# Patient Record
Sex: Female | Born: 1985 | Race: White | Hispanic: No | Marital: Married | Smoking: Never smoker
Health system: Southern US, Community
[De-identification: ages and names within clinical notes are randomized; demographics above are authoritative.]

## PROBLEM LIST (undated history)

## (undated) DIAGNOSIS — Z789 Other specified health status: Secondary | ICD-10-CM

## (undated) SURGERY — Surgical Case
Anesthesia: General

---

## 2016-05-05 ENCOUNTER — Other Ambulatory Visit: Payer: Self-pay | Admitting: Obstetrics & Gynecology

## 2016-05-05 DIAGNOSIS — Z3689 Encounter for other specified antenatal screening: Secondary | ICD-10-CM

## 2016-06-15 ENCOUNTER — Other Ambulatory Visit: Payer: Self-pay | Admitting: Obstetrics & Gynecology

## 2016-06-15 ENCOUNTER — Ambulatory Visit
Admission: RE | Admit: 2016-06-15 | Discharge: 2016-06-15 | Disposition: A | Discharge status: Home / Self Care | Payer: BC Managed Care – PPO | Source: Ambulatory Visit | Attending: Obstetrics and Gynecology | Admitting: Obstetrics and Gynecology

## 2016-06-15 ENCOUNTER — Ambulatory Visit
Admission: RE | Admit: 2016-06-15 | Discharge: 2016-06-15 | Disposition: A | Discharge status: Home / Self Care | Payer: BC Managed Care – PPO | Source: Ambulatory Visit | Attending: Obstetrics & Gynecology | Admitting: Obstetrics & Gynecology

## 2016-06-15 DIAGNOSIS — Z3A20 20 weeks gestation of pregnancy: Secondary | ICD-10-CM | POA: Diagnosis not present

## 2016-06-15 DIAGNOSIS — O321XX2 Maternal care for breech presentation, fetus 2: Secondary | ICD-10-CM | POA: Diagnosis not present

## 2016-06-15 DIAGNOSIS — Z3689 Encounter for other specified antenatal screening: Secondary | ICD-10-CM | POA: Insufficient documentation

## 2016-06-15 DIAGNOSIS — O30032 Twin pregnancy, monochorionic/diamniotic, second trimester: Secondary | ICD-10-CM

## 2016-07-02 ENCOUNTER — Other Ambulatory Visit: Payer: Self-pay | Admitting: *Deleted

## 2016-07-02 DIAGNOSIS — IMO0001 Reserved for inherently not codable concepts without codable children: Secondary | ICD-10-CM

## 2016-07-16 ENCOUNTER — Other Ambulatory Visit: Payer: Self-pay | Admitting: *Deleted

## 2016-07-16 DIAGNOSIS — IMO0001 Reserved for inherently not codable concepts without codable children: Secondary | ICD-10-CM

## 2016-07-20 ENCOUNTER — Ambulatory Visit
Admission: RE | Admit: 2016-07-20 | Discharge: 2016-07-20 | Disposition: A | Discharge status: Home / Self Care | Payer: BC Managed Care – PPO | Source: Ambulatory Visit | Attending: Maternal and Fetal Medicine | Admitting: Maternal and Fetal Medicine

## 2016-07-20 DIAGNOSIS — Z3A25 25 weeks gestation of pregnancy: Secondary | ICD-10-CM | POA: Insufficient documentation

## 2016-07-20 DIAGNOSIS — Z3689 Encounter for other specified antenatal screening: Secondary | ICD-10-CM | POA: Insufficient documentation

## 2016-07-20 DIAGNOSIS — IMO0001 Reserved for inherently not codable concepts without codable children: Secondary | ICD-10-CM

## 2016-07-20 DIAGNOSIS — O30032 Twin pregnancy, monochorionic/diamniotic, second trimester: Secondary | ICD-10-CM | POA: Insufficient documentation

## 2016-07-20 HISTORY — DX: Other specified health status: Z78.9

## 2016-07-23 ENCOUNTER — Other Ambulatory Visit: Payer: Self-pay | Admitting: Obstetrics & Gynecology

## 2016-07-23 ENCOUNTER — Telehealth: Payer: Self-pay | Admitting: Obstetrics & Gynecology

## 2016-07-23 DIAGNOSIS — O30039 Twin pregnancy, monochorionic/diamniotic, unspecified trimester: Secondary | ICD-10-CM

## 2016-07-23 NOTE — Telephone Encounter (Signed)
Patient scheduled for H&P on 10/13/16 @ 2:30pm and OR on 10/15/16. My phone# and ext were given.

## 2016-07-27 ENCOUNTER — Telehealth: Payer: Self-pay

## 2016-07-27 ENCOUNTER — Other Ambulatory Visit: Payer: Self-pay | Admitting: Obstetrics & Gynecology

## 2016-07-27 DIAGNOSIS — O30033 Twin pregnancy, monochorionic/diamniotic, third trimester: Secondary | ICD-10-CM

## 2016-07-27 NOTE — Telephone Encounter (Signed)
Pt is 26wks preg c twins calling to see what she can take for a bad cold c yellow mucus.  She has been taking robitussin for 2d.  Adv she can take robitussin or switch to plain musinex.  Also, plain sudafed, e.s. Tylenol, warm salt water gargles, Hall's cough drops, sucrets, sip hot beverages (watch caffiene)/hot soup, push fluids and rest.

## 2016-07-29 ENCOUNTER — Ambulatory Visit (INDEPENDENT_AMBULATORY_CARE_PROVIDER_SITE_OTHER): Payer: BC Managed Care – PPO | Admitting: Obstetrics & Gynecology

## 2016-07-29 ENCOUNTER — Ambulatory Visit (INDEPENDENT_AMBULATORY_CARE_PROVIDER_SITE_OTHER): Payer: BC Managed Care – PPO

## 2016-07-29 ENCOUNTER — Other Ambulatory Visit: Payer: BC Managed Care – PPO

## 2016-07-29 VITALS — BP 110/70 | Wt 158.0 lb

## 2016-07-29 DIAGNOSIS — Z3A26 26 weeks gestation of pregnancy: Secondary | ICD-10-CM

## 2016-07-29 DIAGNOSIS — O30033 Twin pregnancy, monochorionic/diamniotic, third trimester: Secondary | ICD-10-CM

## 2016-07-29 DIAGNOSIS — O30009 Twin pregnancy, unspecified number of placenta and unspecified number of amniotic sacs, unspecified trimester: Secondary | ICD-10-CM | POA: Insufficient documentation

## 2016-07-29 DIAGNOSIS — O30039 Twin pregnancy, monochorionic/diamniotic, unspecified trimester: Secondary | ICD-10-CM

## 2016-07-29 DIAGNOSIS — Z113 Encounter for screening for infections with a predominantly sexual mode of transmission: Secondary | ICD-10-CM

## 2016-07-29 DIAGNOSIS — Z131 Encounter for screening for diabetes mellitus: Secondary | ICD-10-CM

## 2016-07-29 DIAGNOSIS — Z98891 History of uterine scar from previous surgery: Secondary | ICD-10-CM | POA: Insufficient documentation

## 2016-07-29 DIAGNOSIS — O09819 Supervision of pregnancy resulting from assisted reproductive technology, unspecified trimester: Secondary | ICD-10-CM | POA: Insufficient documentation

## 2016-07-29 NOTE — Progress Notes (Signed)
Mo/Di Twins, doing well, US normal for growth and AFI, Vtx/Vtx, plans CS May 24 (PH) DP every 2 weeks to monitor for TTTS APT 32 weeks Velamenrous insertion 3cm from cervix, risks discussed. Plans Breast feeding.  Possible IUD.

## 2016-07-30 ENCOUNTER — Other Ambulatory Visit: Payer: Self-pay | Admitting: *Deleted

## 2016-07-30 DIAGNOSIS — O30009 Twin pregnancy, unspecified number of placenta and unspecified number of amniotic sacs, unspecified trimester: Secondary | ICD-10-CM

## 2016-07-30 LAB — 28 WEEK RH+PANEL
BASOS: 0 %
Basophils Absolute: 0 10*3/uL (ref 0.0–0.2)
EOS (ABSOLUTE): 0.1 10*3/uL (ref 0.0–0.4)
Eos: 1 %
GESTATIONAL DIABETES SCREEN: 117 mg/dL (ref 65–139)
HEMOGLOBIN: 9.6 g/dL — AB (ref 11.1–15.9)
HIV Screen 4th Generation wRfx: NONREACTIVE
Hematocrit: 28.2 % — ABNORMAL LOW (ref 34.0–46.6)
IMMATURE GRANS (ABS): 0 10*3/uL (ref 0.0–0.1)
Immature Granulocytes: 0 %
LYMPHS ABS: 1.2 10*3/uL (ref 0.7–3.1)
Lymphs: 15 %
MCH: 31.5 pg (ref 26.6–33.0)
MCHC: 34 g/dL (ref 31.5–35.7)
MCV: 93 fL (ref 79–97)
Monocytes Absolute: 0.5 10*3/uL (ref 0.1–0.9)
Monocytes: 6 %
NEUTROS ABS: 6.1 10*3/uL (ref 1.4–7.0)
Neutrophils: 78 %
Platelets: 192 10*3/uL (ref 150–379)
RBC: 3.05 x10E6/uL — AB (ref 3.77–5.28)
RDW: 12.9 % (ref 12.3–15.4)
RPR: NONREACTIVE
WBC: 7.9 10*3/uL (ref 3.4–10.8)

## 2016-08-03 ENCOUNTER — Ambulatory Visit
Admission: RE | Admit: 2016-08-03 | Discharge: 2016-08-03 | Disposition: A | Discharge status: Home / Self Care | Payer: BC Managed Care – PPO | Source: Ambulatory Visit | Attending: Obstetrics and Gynecology | Admitting: Obstetrics and Gynecology

## 2016-08-03 DIAGNOSIS — O30032 Twin pregnancy, monochorionic/diamniotic, second trimester: Secondary | ICD-10-CM | POA: Insufficient documentation

## 2016-08-03 DIAGNOSIS — Z3A27 27 weeks gestation of pregnancy: Secondary | ICD-10-CM | POA: Diagnosis not present

## 2016-08-03 DIAGNOSIS — O30009 Twin pregnancy, unspecified number of placenta and unspecified number of amniotic sacs, unspecified trimester: Secondary | ICD-10-CM

## 2016-08-03 NOTE — Progress Notes (Signed)
Sp[facility] with [facility] at [facility] [place] [facility] and requested an appointment for a fetal echo ASAP.  Selena BattenKim is calling patient to schedule.

## 2016-08-04 ENCOUNTER — Telehealth: Payer: Self-pay

## 2016-08-04 NOTE — Telephone Encounter (Signed)
Rec Mucinex for congestion and drainage; if fever or severe cough then appt

## 2016-08-04 NOTE — Telephone Encounter (Signed)
Pt states she is 28 wks w/twins & is struggling w/cold x2.5 wks. Drainage has turned green. She's having severe headache unrelieved by Tylenol. No relief w/OTC meds. Difficulty sleeping. WU#[id number]#[phone].

## 2016-08-05 NOTE — Telephone Encounter (Signed)
Left message relaying [facility] recommendations. Advised to call if needs/desires apt.

## 2016-08-12 ENCOUNTER — Ambulatory Visit (INDEPENDENT_AMBULATORY_CARE_PROVIDER_SITE_OTHER): Payer: BC Managed Care – PPO | Admitting: Advanced Practice Midwife

## 2016-08-12 VITALS — BP 122/72 | Wt 167.0 lb

## 2016-08-12 DIAGNOSIS — Z3A28 28 weeks gestation of pregnancy: Secondary | ICD-10-CM

## 2016-08-12 NOTE — Progress Notes (Signed)
Doing well. c/o pain and pressure in pelvis. Recommend support belt and increased hydration. Positive fetal movement x2. Baby A is on maternal right and is vertex. FHR 150. Baby B is on maternal left and is vertex. FHR 130. Echocardiogram wnl per pt report.

## 2016-08-12 NOTE — Progress Notes (Signed)
Vaginal area pressure x 3 days/back hurts/hard to rest at night

## 2016-08-12 NOTE — Patient Instructions (Signed)
Multiple Pregnancy Having a multiple pregnancy means that a woman is carrying more than one baby at a time. She may be pregnant with twins, triplets, or more. The majority of multiple pregnancies are twins. Naturally conceiving triplets or more (higher-order multiples) is rare. Multiple pregnancies are riskier than single pregnancies. A woman with a multiple pregnancy is more likely to have certain problems during her pregnancy. Therefore, she will need to have more frequent appointments for prenatal care. How does a multiple pregnancy happen? A multiple pregnancy happens when:  The woman's body releases more than one egg at a time, and then each egg gets fertilized by a different sperm.  This is the most common type of multiple pregnancy.  Twins or other multiples produced this way are fraternal. They are no more alike than non-multiple siblings are.  One sperm fertilizes one egg, which then divides into more than one embryo.  Twins or other multiples produced this way are identical. Identical multiples are always the same gender, and they look very much alike. Who is most likely to have a multiple pregnancy? A multiple pregnancy is more likely to develop in women who:  Have had fertility treatment, especially if the treatment included fertility drugs.  Are older than 31 years of age.  Have already had four or more children.  Have a family history of multiple pregnancy. How is a multiple pregnancy diagnosed? A multiple pregnancy may be diagnosed based on:  Symptoms such as:  Rapid weight gain in the first 3 months of pregnancy (first trimester).  More severe nausea and breast tenderness than what is typical of a single pregnancy.  The uterus measuring larger than what is normal for the stage of the pregnancy.  Blood tests that detect a higher-than-normal level of human chorionic gonadotropin (hCG). This is a hormone that your body produces in early pregnancy.  Ultrasound exam.  This is used to confirm that you are carrying multiples. What risks are associated with multiple pregnancy? A multiple pregnancy puts you at a higher risk for certain problems during or after your pregnancy, including:  Having your babies delivered before you have reached a full-term pregnancy (preterm birth). A full-term pregnancy lasts for at least 37 weeks. Babies born before 37 weeks may have a higher risk of a variety of health problems, such as breathing problems, feeding difficulties, cerebral palsy, and learning disabilities.  Diabetes.  Preeclampsia. This is a serious condition that causes high blood pressure along with other symptoms, such as swelling and headaches, during pregnancy.  Excessive blood loss after childbirth (postpartum hemorrhage).  Postpartum depression.  Low birth weight of the babies. How will having a multiple pregnancy affect my care? Your health care provider will want to monitor you more closely during your pregnancy to make sure that your babies are growing normally and that you are healthy. Follow these instructions at home: Because your pregnancy is considered to be high risk, you will need to work closely with your health care team. You may also need to make some lifestyle changes. These may include the following: Eating and drinking   Increase your nutrition.  Follow your health care provider's recommendations for weight gain. You may need to gain a little extra weight when you are pregnant with multiples.  Eat healthy snacks often throughout the day. This can add calories and reduce nausea.  Drink enough fluid to keep your urine clear or pale yellow.  Take prenatal vitamins. Activity  By 20-24 weeks, you may need to   limit your activities.  Avoid activities and work that take a lot of effort (are strenuous).  Ask your health care provider when you should stop having sexual intercourse.  Rest often. General instructions   Do not use any  products that contain nicotine or tobacco, such as cigarettes and e-cigarettes. If you need help quitting, ask your health care provider.  Do not drink alcohol or use illegal drugs.  Take over-the-counter and prescription medicines only as told by your health care provider.  Arrange for extra help around the house.  Keep all follow-up visits and all prenatal visits as told by your health care provider. This is important. Contact a health care provider if:  You have dizziness.  You have persistent nausea, vomiting, or diarrhea.  You are having trouble gaining weight.  You have feelings of depression or other emotions that are interfering with your normal activities. Get help right away if:  You have a fever.  You have pain with urination.  You have fluid leaking from your vagina.  You have a bad-smelling vaginal discharge.  You notice increased swelling in your face, hands, legs, or ankles.  You have spotting or bleeding from your vagina.  You have pelvic cramps, pelvic pressure, or nagging pain in your abdomen or lower back.  You are having regular contractions.  You develop a severe headache, with or without visual changes.  You have shortness of breath or chest pain.  You notice less fetal movement, or no fetal movement. This information is not intended to replace advice given to you by your health care provider. Make sure you discuss any questions you have with your health care provider. Document Released: 02/18/2008 Document Revised: 01/10/2016 Document Reviewed: 01/10/2016 Elsevier Interactive Patient Education  2017 [employer]. Third Trimester of Pregnancy The third trimester is from week 28 through week 40 (months 7 through 9). The third trimester is a time when the unborn baby (fetus) is growing rapidly. At the end of the ninth month, the fetus is about 20 inches in length and weighs 6-10 pounds. Body changes during your third trimester Your body will  continue to go through many changes during pregnancy. The changes vary from woman to woman. During the third trimester:  Your weight will continue to increase. You can expect to gain 25-35 pounds (11-16 kg) by the end of the pregnancy.  You may begin to get stretch marks on your hips, abdomen, and breasts.  You may urinate more often because the fetus is moving lower into your pelvis and pressing on your bladder.  You may develop or continue to have heartburn. This is caused by increased hormones that slow down muscles in the digestive tract.  You may develop or continue to have constipation because increased hormones slow digestion and cause the muscles that push waste through your intestines to relax.  You may develop hemorrhoids. These are swollen veins (varicose veins) in the rectum that can itch or be painful.  You may develop swollen, bulging veins (varicose veins) in your legs.  You may have increased body aches in the pelvis, back, or thighs. This is due to weight gain and increased hormones that are relaxing your joints.  You may have changes in your hair. These can include thickening of your hair, rapid growth, and changes in texture. Some women also have hair loss during or after pregnancy, or hair that feels dry or thin. Your hair will most likely return to normal after your baby is born.  Your  breasts will continue to grow and they will continue to become tender. A yellow fluid (colostrum) may leak from your breasts. This is the first milk you are producing for your baby.  Your belly button may stick out.  You may notice more swelling in your hands, face, or ankles.  You may have increased tingling or numbness in your hands, arms, and legs. The skin on your belly may also feel numb.  You may feel short of breath because of your expanding uterus.  You may have more problems sleeping. This can be caused by the size of your belly, increased need to urinate, and an increase in  your body's metabolism.  You may notice the fetus "dropping," or moving lower in your abdomen (lightening).  You may have increased vaginal discharge.  You may notice your joints feel loose and you may have pain around your pelvic bone. What to expect at prenatal visits You will have prenatal exams every 2 weeks until week 36. Then you will have weekly prenatal exams. During a routine prenatal visit:  You will be weighed to make sure you and the baby are growing normally.  Your blood pressure will be taken.  Your abdomen will be measured to track your baby's growth.  The fetal heartbeat will be listened to.  Any test results from the previous visit will be discussed.  You may have a cervical check near your due date to see if your cervix has softened or thinned (effaced).  You will be tested for Group B streptococcus. This happens between 35 and 37 weeks. Your health care provider may ask you:  What your birth plan is.  How you are feeling.  If you are feeling the baby move.  If you have had any abnormal symptoms, such as leaking fluid, bleeding, severe headaches, or abdominal cramping.  If you are using any tobacco products, including cigarettes, chewing tobacco, and electronic cigarettes.  If you have any questions. Other tests or screenings that may be performed during your third trimester include:  Blood tests that check for low iron levels (anemia).  Fetal testing to check the health, activity level, and growth of the fetus. Testing is done if you have certain medical conditions or if there are problems during the pregnancy.  Nonstress test (NST). This test checks the health of your baby to make sure there are no signs of problems, such as the baby not getting enough oxygen. During this test, a belt is placed around your belly. The baby is made to move, and its heart rate is monitored during movement. What is false labor? False labor is a condition in which you feel  small, irregular tightenings of the muscles in the womb (contractions) that usually go away with rest, changing position, or drinking water. These are called Braxton Hicks contractions. Contractions may last for hours, days, or even weeks before true labor sets in. If contractions come at regular intervals, become more frequent, increase in intensity, or become painful, you should see your health care provider. What are the signs of labor?  Abdominal cramps.  Regular contractions that start at 10 minutes apart and become stronger and more frequent with time.  Contractions that start on the top of the uterus and spread down to the lower abdomen and back.  Increased pelvic pressure and dull back pain.  A watery or bloody mucus discharge that comes from the vagina.  Leaking of amniotic fluid. This is also known as your "water breaking." It could  be a slow trickle or a gush. Let your health care provider know if it has a color or strange odor. If you have any of these signs, call your health care provider right away, even if it is before your due date. Follow these instructions at home: Medicines   Follow your health care provider's instructions regarding medicine use. Specific medicines may be either safe or unsafe to take during pregnancy.  Take a prenatal vitamin that contains at least 600 micrograms (mcg) of folic acid.  If you develop constipation, try taking a stool softener if your health care provider approves. Eating and drinking   Eat a balanced diet that includes fresh fruits and vegetables, whole grains, good sources of protein such as meat, eggs, or tofu, and low-fat dairy. Your health care provider will help you determine the amount of weight gain that is right for you.  Avoid raw meat and uncooked cheese. These carry germs that can cause birth defects in the baby.  If you have low calcium intake from food, talk to your health care provider about whether you should take a daily  calcium supplement.  Eat four or five small meals rather than three large meals a day.  Limit foods that are high in fat and processed sugars, such as fried and sweet foods.  To prevent constipation:  Drink enough fluid to keep your urine clear or pale yellow.  Eat foods that are high in fiber, such as fresh fruits and vegetables, whole grains, and beans. Activity   Exercise only as directed by your health care provider. Most women can continue their usual exercise routine during pregnancy. Try to exercise for 30 minutes at least 5 days a week. Stop exercising if you experience uterine contractions.  Avoid heavy lifting.  Do not exercise in extreme heat or humidity, or at high altitudes.  Wear low-heel, comfortable shoes.  Practice good posture.  You may continue to have sex unless your health care provider tells you otherwise. Relieving pain and discomfort   Take frequent breaks and rest with your legs elevated if you have leg cramps or low back pain.  Take warm sitz baths to soothe any pain or discomfort caused by hemorrhoids. Use hemorrhoid cream if your health care provider approves.  Wear a good support bra to prevent discomfort from breast tenderness.  If you develop varicose veins:  Wear support pantyhose or compression stockings as told by your healthcare provider.  Elevate your feet for 15 minutes, 3-4 times a day. Prenatal care   Write down your questions. Take them to your prenatal visits.  Keep all your prenatal visits as told by your health care provider. This is important. Safety   Wear your seat belt at all times when driving.  Make a list of emergency phone numbers, including numbers for family, friends, the hospital, and police and fire departments. General instructions   Avoid cat litter boxes and soil used by cats. These carry germs that can cause birth defects in the baby. If you have a cat, ask someone to clean the litter box for you.  Do not  travel far distances unless it is absolutely necessary and only with the approval of your health care provider.  Do not use hot tubs, steam rooms, or saunas.  Do not drink alcohol.  Do not use any products that contain nicotine or tobacco, such as cigarettes and e-cigarettes. If you need help quitting, ask your health care provider.  Do not use any medicinal herbs  or unprescribed drugs. These chemicals affect the formation and growth of the baby.  Do not douche or use tampons or scented sanitary pads.  Do not cross your legs for long periods of time.  To prepare for the arrival of your baby:  Take prenatal classes to understand, practice, and ask questions about labor and delivery.  Make a trial run to the hospital.  Visit the hospital and tour the maternity area.  Arrange for maternity or paternity leave through employers.  Arrange for family and friends to take care of pets while you are in the hospital.  Purchase a rear-facing car seat and make sure you know how to install it in your car.  Pack your hospital bag.  Prepare the baby's nursery. Make sure to remove all pillows and stuffed animals from the baby's crib to prevent suffocation.  Visit your dentist if you have not gone during your pregnancy. Use a soft toothbrush to brush your teeth and be gentle when you floss. Contact a health care provider if:  You are unsure if you are in labor or if your water has broken.  You become dizzy.  You have mild pelvic cramps, pelvic pressure, or nagging pain in your abdominal area.  You have lower back pain.  You have persistent nausea, vomiting, or diarrhea.  You have an unusual or bad smelling vaginal discharge.  You have pain when you urinate. Get help right away if:  Your water breaks before 37 weeks.  You have regular contractions less than 5 minutes apart before 37 weeks.  You have a fever.  You are leaking fluid from your vagina.  You have spotting or  bleeding from your vagina.  You have severe abdominal pain or cramping.  You have rapid weight loss or weight gain.  You have shortness of breath with chest pain.  You notice sudden or extreme swelling of your face, hands, ankles, feet, or legs.  Your baby makes fewer than 10 movements in 2 hours.  You have severe headaches that do not go away when you take medicine.  You have vision changes. Summary  The third trimester is from week 28 through week 40, months 7 through 9. The third trimester is a time when the unborn baby (fetus) is growing rapidly.  During the third trimester, your discomfort may increase as you and your baby continue to gain weight. You may have abdominal, leg, and back pain, sleeping problems, and an increased need to urinate.  During the third trimester your breasts will keep growing and they will continue to become tender. A yellow fluid (colostrum) may leak from your breasts. This is the first milk you are producing for your baby.  False labor is a condition in which you feel small, irregular tightenings of the muscles in the womb (contractions) that eventually go away. These are called Braxton Hicks contractions. Contractions may last for hours, days, or even weeks before true labor sets in.  Signs of labor can include: abdominal cramps; regular contractions that start at 10 minutes apart and become stronger and more frequent with time; watery or bloody mucus discharge that comes from the vagina; increased pelvic pressure and dull back pain; and leaking of amniotic fluid. This information is not intended to replace advice given to you by your health care provider. Make sure you discuss any questions you have with your health care provider. Document Released: 05/05/2001 Document Revised: 10/17/2015 Document Reviewed: 07/12/2012 Elsevier Interactive Patient Education  2017 [employer].

## 2016-08-13 ENCOUNTER — Observation Stay: Payer: BC Managed Care – PPO | Admitting: Anesthesiology

## 2016-08-13 ENCOUNTER — Inpatient Hospital Stay
Admission: EM | Admit: 2016-08-13 | Discharge: 2016-08-14 | DRG: 765 | Disposition: A | Discharge status: Other Acute Inpatient Hospital | Payer: BC Managed Care – PPO | Attending: Obstetrics and Gynecology | Admitting: Obstetrics and Gynecology

## 2016-08-13 ENCOUNTER — Other Ambulatory Visit: Payer: Self-pay | Admitting: *Deleted

## 2016-08-13 ENCOUNTER — Encounter
Admission: EM | Disposition: A | Discharge status: Other Acute Inpatient Hospital | Payer: Self-pay | Source: Home / Self Care | Attending: Obstetrics and Gynecology

## 2016-08-13 ENCOUNTER — Telehealth: Payer: Self-pay

## 2016-08-13 DIAGNOSIS — R102 Pelvic and perineal pain: Secondary | ICD-10-CM | POA: Diagnosis present

## 2016-08-13 DIAGNOSIS — O30033 Twin pregnancy, monochorionic/diamniotic, third trimester: Secondary | ICD-10-CM | POA: Diagnosis present

## 2016-08-13 DIAGNOSIS — Z3A28 28 weeks gestation of pregnancy: Secondary | ICD-10-CM

## 2016-08-13 DIAGNOSIS — O34211 Maternal care for low transverse scar from previous cesarean delivery: Secondary | ICD-10-CM | POA: Diagnosis present

## 2016-08-13 DIAGNOSIS — IMO0001 Reserved for inherently not codable concepts without codable children: Secondary | ICD-10-CM

## 2016-08-13 LAB — CBC WITH DIFFERENTIAL/PLATELET
Basophils Absolute: 0 K/uL (ref 0–0.1)
Basophils Relative: 0 %
Eosinophils Absolute: 0.1 K/uL (ref 0–0.7)
Eosinophils Relative: 1 %
HCT: 33.2 % — ABNORMAL LOW (ref 35.0–47.0)
Hemoglobin: 11.4 g/dL — ABNORMAL LOW (ref 12.0–16.0)
Lymphocytes Relative: 12 %
Lymphs Abs: 1.8 K/uL (ref 1.0–3.6)
MCH: 30.8 pg (ref 26.0–34.0)
MCHC: 34.2 g/dL (ref 32.0–36.0)
MCV: 90.1 fL (ref 80.0–100.0)
Monocytes Absolute: 0.9 K/uL (ref 0.2–0.9)
Monocytes Relative: 6 %
Neutro Abs: 12.8 K/uL — ABNORMAL HIGH (ref 1.4–6.5)
Neutrophils Relative %: 81 %
Platelets: 307 K/uL (ref 150–440)
RBC: 3.69 MIL/uL — ABNORMAL LOW (ref 3.80–5.20)
RDW: 12.8 % (ref 11.5–14.5)
WBC: 15.7 K/uL — ABNORMAL HIGH (ref 3.6–11.0)

## 2016-08-13 LAB — URINALYSIS, COMPLETE (UACMP) WITH MICROSCOPIC
Bacteria, UA: NONE SEEN
Bilirubin Urine: NEGATIVE
Glucose, UA: NEGATIVE mg/dL
Hgb urine dipstick: NEGATIVE
Ketones, ur: NEGATIVE mg/dL
Leukocytes, UA: NEGATIVE
Nitrite: NEGATIVE
Protein, ur: NEGATIVE mg/dL
Specific Gravity, Urine: 1.008 (ref 1.005–1.030)
pH: 6 (ref 5.0–8.0)

## 2016-08-13 LAB — TYPE AND SCREEN
ABO/RH(D): O POS
Antibody Screen: NEGATIVE

## 2016-08-13 LAB — GLUCOSE, CAPILLARY: GLUCOSE-CAPILLARY: 83 mg/dL (ref 65–99)

## 2016-08-13 SURGERY — Surgical Case
Anesthesia: General

## 2016-08-13 MED ORDER — SIMETHICONE 80 MG PO CHEW
80.0000 mg | CHEWABLE_TABLET | Freq: Three times a day (TID) | ORAL | Status: DC
  Administered 2016-08-14 (×2): 80 mg via ORAL
  Filled 2016-08-13 (×2): qty 1

## 2016-08-13 MED ORDER — OXYCODONE HCL 5 MG PO TABS
5.0000 mg | ORAL_TABLET | Freq: Once | ORAL | Status: DC | PRN
Start: 2016-08-13 — End: 2016-08-13

## 2016-08-13 MED ORDER — SUCCINYLCHOLINE CHLORIDE 20 MG/ML IJ SOLN
INTRAMUSCULAR | Status: DC | PRN
  Administered 2016-08-13: 100 mg via INTRAVENOUS

## 2016-08-13 MED ORDER — AMPICILLIN SODIUM 2 G IJ SOLR
2.0000 g | Freq: Once | INTRAMUSCULAR | Status: AC
  Administered 2016-08-13: 2 g via INTRAVENOUS
  Filled 2016-08-13: qty 2000

## 2016-08-13 MED ORDER — PROPOFOL 10 MG/ML IV BOLUS
INTRAVENOUS | Status: AC
  Filled 2016-08-13: qty 20

## 2016-08-13 MED ORDER — OXYCODONE HCL 5 MG/5ML PO SOLN: 5.0000 mg | Freq: Once | ORAL | Status: DC | PRN

## 2016-08-13 MED ORDER — OXYTOCIN 10 UNIT/ML IJ SOLN
INTRAMUSCULAR | Status: AC
  Filled 2016-08-13: qty 2

## 2016-08-13 MED ORDER — BUPIVACAINE 0.25 % ON-Q PUMP DUAL CATH 400 ML
400.0000 mL | INJECTION | Status: DC
  Filled 2016-08-13 (×3): qty 400

## 2016-08-13 MED ORDER — LACTATED RINGERS IV SOLN
INTRAVENOUS | Status: DC
  Administered 2016-08-13 – 2016-08-14 (×2): via INTRAVENOUS

## 2016-08-13 MED ORDER — BETAMETHASONE SOD PHOS & ACET 6 (3-3) MG/ML IJ SUSP
INTRAMUSCULAR | Status: AC
  Administered 2016-08-13: 12 mg via INTRAMUSCULAR
  Filled 2016-08-13: qty 1

## 2016-08-13 MED ORDER — BUPIVACAINE HCL (PF) 0.5 % IJ SOLN
INTRAMUSCULAR | Status: DC | PRN
  Administered 2016-08-13: 10 mL

## 2016-08-13 MED ORDER — OXYTOCIN 10 UNIT/ML IJ SOLN
INTRAMUSCULAR | Status: DC | PRN
  Administered 2016-08-13: 10 [IU] via INTRAMUSCULAR

## 2016-08-13 MED ORDER — OXYCODONE-ACETAMINOPHEN 5-325 MG PO TABS
2.0000 | ORAL_TABLET | ORAL | Status: DC | PRN
  Administered 2016-08-14: 2 via ORAL
  Filled 2016-08-13: qty 2

## 2016-08-13 MED ORDER — SIMETHICONE 80 MG PO CHEW
80.0000 mg | CHEWABLE_TABLET | ORAL | Status: DC
  Administered 2016-08-14: 80 mg via ORAL
  Filled 2016-08-13: qty 1

## 2016-08-13 MED ORDER — MAGNESIUM SULFATE 50 % IJ SOLN
2.0000 g/h | INTRAVENOUS | Status: DC
  Administered 2016-08-13: 2 g/h via INTRAVENOUS

## 2016-08-13 MED ORDER — COCONUT OIL OIL
1.0000 "application " | TOPICAL_OIL | Status: DC | PRN
  Administered 2016-08-14: 1 via TOPICAL
  Filled 2016-08-13: qty 120

## 2016-08-13 MED ORDER — ONDANSETRON HCL 4 MG/2ML IJ SOLN
INTRAMUSCULAR | Status: DC | PRN
  Administered 2016-08-13: 4 mg via INTRAVENOUS

## 2016-08-13 MED ORDER — LACTATED RINGERS IV SOLN
INTRAVENOUS | Status: DC | PRN
Start: 2016-08-13 — End: 2016-08-13
  Administered 2016-08-13: 19:00:00 via INTRAVENOUS

## 2016-08-13 MED ORDER — SENNOSIDES-DOCUSATE SODIUM 8.6-50 MG PO TABS: 2.0000 | ORAL_TABLET | ORAL | Status: DC

## 2016-08-13 MED ORDER — CEFAZOLIN SODIUM-DEXTROSE 2-4 GM/100ML-% IV SOLN: 2.0000 g | INTRAVENOUS | Status: DC

## 2016-08-13 MED ORDER — LACTATED RINGERS IV SOLN
INTRAVENOUS | Status: DC
  Administered 2016-08-14: 16:00:00 via INTRAVENOUS

## 2016-08-13 MED ORDER — OXYTOCIN 40 UNITS IN LACTATED RINGERS INFUSION - SIMPLE MED
INTRAVENOUS | Status: DC | PRN
  Administered 2016-08-13: 500 mL via INTRAVENOUS

## 2016-08-13 MED ORDER — CEFAZOLIN IN D5W 1 GM/50ML IV SOLN
INTRAVENOUS | Status: AC
  Filled 2016-08-13: qty 100

## 2016-08-13 MED ORDER — OXYCODONE-ACETAMINOPHEN 5-325 MG PO TABS: 1.0000 | ORAL_TABLET | ORAL | Status: DC | PRN

## 2016-08-13 MED ORDER — FENTANYL CITRATE (PF) 250 MCG/5ML IJ SOLN
INTRAMUSCULAR | Status: AC
  Filled 2016-08-13: qty 5

## 2016-08-13 MED ORDER — DEXAMETHASONE SODIUM PHOSPHATE 10 MG/ML IJ SOLN
INTRAMUSCULAR | Status: AC
  Filled 2016-08-13: qty 1

## 2016-08-13 MED ORDER — LACTATED RINGERS IV SOLN: INTRAVENOUS | Status: DC

## 2016-08-13 MED ORDER — DIBUCAINE 1 % RE OINT: 1.0000 "application " | TOPICAL_OINTMENT | RECTAL | Status: DC | PRN

## 2016-08-13 MED ORDER — SIMETHICONE 80 MG PO CHEW: 80.0000 mg | CHEWABLE_TABLET | ORAL | Status: DC | PRN

## 2016-08-13 MED ORDER — IBUPROFEN 600 MG PO TABS
600.0000 mg | ORAL_TABLET | Freq: Four times a day (QID) | ORAL | Status: DC
  Administered 2016-08-14 (×3): 600 mg via ORAL
  Filled 2016-08-13 (×3): qty 1

## 2016-08-13 MED ORDER — PROPOFOL 10 MG/ML IV BOLUS
INTRAVENOUS | Status: DC | PRN
  Administered 2016-08-13: 120 mg via INTRAVENOUS

## 2016-08-13 MED ORDER — FENTANYL CITRATE (PF) 100 MCG/2ML IJ SOLN: 25.0000 ug | INTRAMUSCULAR | Status: DC | PRN

## 2016-08-13 MED ORDER — OXYTOCIN 40 UNITS IN LACTATED RINGERS INFUSION - SIMPLE MED
INTRAVENOUS | Status: AC
  Filled 2016-08-13: qty 1000

## 2016-08-13 MED ORDER — WITCH HAZEL-GLYCERIN EX PADS: 1.0000 "application " | MEDICATED_PAD | CUTANEOUS | Status: DC | PRN

## 2016-08-13 MED ORDER — OXYTOCIN 40 UNITS IN LACTATED RINGERS INFUSION - SIMPLE MED: 2.5000 [IU]/h | INTRAVENOUS | Status: AC

## 2016-08-13 MED ORDER — SOD CITRATE-CITRIC ACID 500-334 MG/5ML PO SOLN
ORAL | Status: AC
  Administered 2016-08-13: 30 mL
  Filled 2016-08-13: qty 15

## 2016-08-13 MED ORDER — CEFAZOLIN SODIUM-DEXTROSE 2-3 GM-% IV SOLR
2.0000 g | INTRAVENOUS | Status: AC
  Administered 2016-08-13: 2 g via INTRAVENOUS
  Filled 2016-08-13: qty 50

## 2016-08-13 MED ORDER — MAGNESIUM SULFATE 50 % IJ SOLN
INTRAVENOUS | Status: AC
  Administered 2016-08-13: 2 g/h via INTRAVENOUS
  Filled 2016-08-13: qty 80

## 2016-08-13 MED ORDER — OXYTOCIN 10 UNIT/ML IJ SOLN
INTRAMUSCULAR | Status: AC
  Filled 2016-08-13: qty 1

## 2016-08-13 MED ORDER — METHYLERGONOVINE MALEATE 0.2 MG/ML IJ SOLN
INTRAMUSCULAR | Status: AC
  Filled 2016-08-13: qty 1

## 2016-08-13 MED ORDER — BUPIVACAINE-EPINEPHRINE 0.5% -1:200000 IJ SOLN
20.0000 mL | Freq: Once | INTRAMUSCULAR | Status: DC
  Filled 2016-08-13: qty 20

## 2016-08-13 MED ORDER — ACETAMINOPHEN 10 MG/ML IV SOLN
INTRAVENOUS | Status: AC
  Filled 2016-08-13: qty 100

## 2016-08-13 MED ORDER — LACTATED RINGERS IV BOLUS (SEPSIS)
1000.0000 mL | Freq: Once | INTRAVENOUS | Status: AC
  Administered 2016-08-13: 1000 mL via INTRAVENOUS

## 2016-08-13 MED ORDER — MAGNESIUM SULFATE BOLUS VIA INFUSION
4.0000 g | Freq: Once | INTRAVENOUS | Status: AC
  Administered 2016-08-13: 4 g via INTRAVENOUS
  Filled 2016-08-13: qty 500

## 2016-08-13 MED ORDER — ONDANSETRON HCL 4 MG/2ML IJ SOLN
INTRAMUSCULAR | Status: AC
  Filled 2016-08-13: qty 2

## 2016-08-13 MED ORDER — SODIUM CHLORIDE 0.9 % IV SOLN
1.0000 g | INTRAVENOUS | Status: DC
  Filled 2016-08-13 (×4): qty 1000

## 2016-08-13 MED ORDER — ACETAMINOPHEN 325 MG PO TABS: 650.0000 mg | ORAL_TABLET | ORAL | Status: DC | PRN

## 2016-08-13 MED ORDER — ACETAMINOPHEN 10 MG/ML IV SOLN
INTRAVENOUS | Status: DC | PRN
Start: 2016-08-13 — End: 2016-08-13
  Administered 2016-08-13: 1000 mg via INTRAVENOUS

## 2016-08-13 MED ORDER — BUPIVACAINE HCL (PF) 0.5 % IJ SOLN
INTRAMUSCULAR | Status: AC
  Filled 2016-08-13: qty 30

## 2016-08-13 MED ORDER — PRENATAL MULTIVITAMIN CH
1.0000 | ORAL_TABLET | Freq: Every day | ORAL | Status: DC
  Administered 2016-08-14: 1 via ORAL
  Filled 2016-08-13: qty 1

## 2016-08-13 MED ORDER — FENTANYL CITRATE (PF) 100 MCG/2ML IJ SOLN
INTRAMUSCULAR | Status: DC | PRN
  Administered 2016-08-13: 150 ug via INTRAVENOUS
  Administered 2016-08-13: 100 ug via INTRAVENOUS

## 2016-08-13 MED ORDER — PHENYLEPHRINE HCL 10 MG/ML IJ SOLN
INTRAMUSCULAR | Status: DC | PRN
  Administered 2016-08-13: 120 ug via INTRAVENOUS

## 2016-08-13 MED ORDER — MIDAZOLAM HCL 2 MG/2ML IJ SOLN
INTRAMUSCULAR | Status: DC | PRN
  Administered 2016-08-13: 2 mg via INTRAVENOUS

## 2016-08-13 MED ORDER — BETAMETHASONE SOD PHOS & ACET 6 (3-3) MG/ML IJ SUSP
12.0000 mg | INTRAMUSCULAR | Status: DC
  Administered 2016-08-13: 12 mg via INTRAMUSCULAR

## 2016-08-13 MED ORDER — MIDAZOLAM HCL 2 MG/2ML IJ SOLN
INTRAMUSCULAR | Status: AC
  Filled 2016-08-13: qty 2

## 2016-08-13 MED ORDER — DEXAMETHASONE SODIUM PHOSPHATE 10 MG/ML IJ SOLN
INTRAMUSCULAR | Status: DC | PRN
  Administered 2016-08-13: 10 mg via INTRAVENOUS

## 2016-08-13 MED ORDER — DIPHENHYDRAMINE HCL 25 MG PO CAPS: 25.0000 mg | ORAL_CAPSULE | Freq: Four times a day (QID) | ORAL | Status: DC | PRN

## 2016-08-13 MED ORDER — MENTHOL 3 MG MT LOZG
1.0000 | LOZENGE | OROMUCOSAL | Status: DC | PRN
  Filled 2016-08-13: qty 9

## 2016-08-13 MED ORDER — PHENYLEPHRINE 40 MCG/ML (10ML) SYRINGE FOR IV PUSH (FOR BLOOD PRESSURE SUPPORT)
PREFILLED_SYRINGE | INTRAVENOUS | Status: AC
  Filled 2016-08-13: qty 10

## 2016-08-13 SURGICAL SUPPLY — 32 items
BAG COUNTER SPONGE EZ (MISCELLANEOUS) ×2 IMPLANT
CANISTER SUCT 3000ML (MISCELLANEOUS) ×3 IMPLANT
CATH KIT ON-Q SILVERSOAK 5IN (CATHETERS) ×12 IMPLANT
CHLORAPREP W/TINT 26ML (MISCELLANEOUS) ×6 IMPLANT
CLOSURE WOUND 1/2 X4 (GAUZE/BANDAGES/DRESSINGS) ×1
COUNTER SPONGE BAG EZ (MISCELLANEOUS) ×1
DERMABOND ADVANCED (GAUZE/BANDAGES/DRESSINGS) ×2
DERMABOND ADVANCED .7 DNX12 (GAUZE/BANDAGES/DRESSINGS) ×1 IMPLANT
DRSG OPSITE POSTOP 4X10 (GAUZE/BANDAGES/DRESSINGS) ×6 IMPLANT
DRSG TEGADERM 4X4.75 (GAUZE/BANDAGES/DRESSINGS) ×3 IMPLANT
DRSG TELFA 3X8 NADH (GAUZE/BANDAGES/DRESSINGS) ×3 IMPLANT
ELECT CAUTERY BLADE 6.4 (BLADE) ×3 IMPLANT
ELECT REM PT RETURN 9FT ADLT (ELECTROSURGICAL) ×3
ELECTRODE REM PT RTRN 9FT ADLT (ELECTROSURGICAL) ×1 IMPLANT
GAUZE SPONGE 4X4 12PLY STRL (GAUZE/BANDAGES/DRESSINGS) ×3 IMPLANT
GLOVE BIO SURGEON STRL SZ7 (GLOVE) ×3 IMPLANT
GLOVE INDICATOR 7.5 STRL GRN (GLOVE) ×3 IMPLANT
GOWN STRL REUS W/ TWL LRG LVL3 (GOWN DISPOSABLE) ×3 IMPLANT
GOWN STRL REUS W/TWL LRG LVL3 (GOWN DISPOSABLE) ×6
LIQUID BAND (GAUZE/BANDAGES/DRESSINGS) ×3 IMPLANT
NS IRRIG 1000ML POUR BTL (IV SOLUTION) ×3 IMPLANT
PACK C SECTION AR (MISCELLANEOUS) ×3 IMPLANT
PAD OB MATERNITY 4.3X12.25 (PERSONAL CARE ITEMS) ×3 IMPLANT
PAD PREP 24X41 OB/GYN DISP (PERSONAL CARE ITEMS) ×3 IMPLANT
SPONGE LAP 18X18 5 PK (GAUZE/BANDAGES/DRESSINGS) ×3 IMPLANT
STRIP CLOSURE SKIN 1/2X4 (GAUZE/BANDAGES/DRESSINGS) ×2 IMPLANT
SUT MNCRL AB 4-0 PS2 18 (SUTURE) ×6 IMPLANT
SUT PDS AB 1 TP1 96 (SUTURE) ×6 IMPLANT
SUT PLAIN 2 0 XLH (SUTURE) ×6 IMPLANT
SUT VIC AB 0 CTX 36 (SUTURE) ×4
SUT VIC AB 0 CTX36XBRD ANBCTRL (SUTURE) ×2 IMPLANT
SUT VIC AB 2-0 CT1 36 (SUTURE) ×3 IMPLANT

## 2016-08-13 NOTE — Plan of Care (Signed)
Pt in OR for cesarean of twins

## 2016-08-13 NOTE — Plan of Care (Signed)
Dr Bonney Aidstaebler in room with bedside ultrasound for position of fhr

## 2016-08-13 NOTE — Discharge Summary (Signed)
Physician Discharge Summary  Patient ID: Chloe Kelley MRN: [id number] DOB/AGE: [birth year] 31 y.o.  Admit date: 08/13/2016 Discharge date: 08/13/2016  Admission Diagnoses: Abdominal pain and pressure  Discharge Diagnoses:  Active Problems:   Labor and delivery indication for care or intervention   Preterm labor in third trimester   Discharged Condition: good  Hospital Course: 31 yo G2P1001 at [redacted]w[redacted]d with Mo/Di twins presenting for pelvic pressure and pain, noted to have significant irritability.  1.5/70/-2 given degree of dilation and patient being symptomatic at [facility] under 30 weeks transferred to [facility] for NICU services should she progress.  Consults: None  Significant Diagnostic Studies:  Results for orders placed or performed during the hospital encounter of 08/13/16 (from the past 24 hour(s))  Urinalysis, Complete w Microscopic     Status: Abnormal   Collection Time: 08/13/16  4:07 PM  Result Value Ref Range   Color, Urine YELLOW (A) YELLOW   APPearance CLEAR (A) CLEAR   Specific Gravity, Urine 1.008 1.005 - 1.030   pH 6.0 5.0 - 8.0   Glucose, UA NEGATIVE NEGATIVE mg/dL   Hgb urine dipstick NEGATIVE NEGATIVE   Bilirubin Urine NEGATIVE NEGATIVE   Ketones, ur NEGATIVE NEGATIVE mg/dL   Protein, ur NEGATIVE NEGATIVE mg/dL   Nitrite NEGATIVE NEGATIVE   Leukocytes, UA NEGATIVE NEGATIVE   RBC / HPF 0-5 0 - 5 RBC/hpf   WBC, UA 0-5 0 - 5 WBC/hpf   Bacteria, UA NONE SEEN NONE SEEN   Squamous Epithelial / LPF 6-30 (A) NONE SEEN   Mucous PRESENT      Treatments: IV hydration and magnesium sulfate, BMZ, and ampicillin  Discharge Exam: Temperature 98.3 F (36.8 C), SpO2 100 %. General appearance: alert, appears stated age and mild distress GI: gravid, soft, non-tender Extremities: extremities normal, atraumatic, no cyanosis or edema  Disposition: Final discharge disposition not confirmed  Discharge Instructions    Discharge activity:  No Restrictions    Complete by:  As  directed    Discharge diet:  No restrictions    Complete by:  As directed    No sexual activity restrictions    Complete by:  As directed    Notify physician for a general feeling that "something is not right"    Complete by:  As directed    Notify physician for increase or change in vaginal discharge    Complete by:  As directed    Notify physician for intestinal cramps, with or without diarrhea, sometimes described as "gas pain"    Complete by:  As directed    Notify physician for leaking of fluid    Complete by:  As directed    Notify physician for low, dull backache, unrelieved by heat or Tylenol    Complete by:  As directed    Notify physician for menstrual like cramps    Complete by:  As directed    Notify physician for pelvic pressure    Complete by:  As directed    Notify physician for uterine contractions.  These may be painless and feel like the uterus is tightening or the baby is  "balling up"    Complete by:  As directed    Notify physician for vaginal bleeding    Complete by:  As directed    PRETERM LABOR:  Includes any of the follwing symptoms that occur between 20 - [redacted] weeks gestation.  If these symptoms are not stopped, preterm labor can result in preterm delivery, placing your baby at risk  Complete by:  As directed      Allergies as of 08/13/2016   No Known Allergies     Medication List    TAKE these medications   multivitamin-prenatal 27-0.8 MG Tabs tablet Take 1 tablet by mouth daily at 12 noon.        Signed: [name] 08/13/2016, 6:20 PM

## 2016-08-13 NOTE — Anesthesia Post-op Follow-up Note (Cosign Needed)
Anesthesia QCDR form completed.        

## 2016-08-13 NOTE — Anesthesia Procedure Notes (Signed)
Procedure Name: Intubation Performed by: Rosaria FerriesPISCITELLO, Julien Oscar K Pre-anesthesia Checklist: Patient identified, Patient being monitored, Timeout performed, Emergency Drugs available and Suction available Patient Re-evaluated:Patient Re-evaluated prior to inductionOxygen Delivery Method: Circle system utilized Preoxygenation: Pre-oxygenation with 100% oxygen Intubation Type: IV induction and Rapid sequence Laryngoscope Size: Miller and 2 Grade View: Grade I Tube type: Oral Tube size: 7.0 mm Number of attempts: 1 Airway Equipment and Method: Stylet Placement Confirmation: ETT inserted through vocal cords under direct vision,  positive ETCO2 and breath sounds checked- equal and bilateral Secured at: 21 cm Tube secured with: Tape Dental Injury: Teeth and Oropharynx as per pre-operative assessment

## 2016-08-13 NOTE — H&P (Signed)
Obstetric H&P   Chief Complaint: Pelvic pressure and pain  Prenatal Care Provider: [facility]  History of Present Illness: 31 y.o. G2P0 [redacted]w[redacted]d by LMP=7wk [place] derived EDC of 10/30/2016, with a monochorionic diamniotic twin pregnancy presenting to L&D with pelvic pressure and pain.  Pain has been presented for the past week but steadily grown more intense.  No LOF, no VB, no ctx, no recent cervical checks or intercourse.  Her obstetric history is notable for prior C-section for breech.  She desires repeat C-section this pregnancy.  Growth appropriate with no evidence of TTTS on most recent MFM ultrasound 08/03/16.  Early ultrasound suggestive of possible velamentous cord insertion not seen on follow up scans  PNL O pos / ABSC neg / RI / VZI / RPR NR / HIV neg / Declined genetic testing / 1-hr 117 / GBS unkown  Review of Systems: 10 point review of systems negative unless otherwise noted in HPI  Past Medical History: Past Medical History:  Diagnosis Date  . Medical history non-contributory     Past Surgical History: Past Surgical History:  Procedure Laterality Date  . CESAREAN SECTION      Family History: No family history on file.  Social History: Social History   Social History  . Marital status: Married    Spouse name: N/A  . Number of children: N/A  . Years of education: N/A   Occupational History  . Not on file.   Social History Main Topics  . Smoking status: Never Smoker  . Smokeless tobacco: Never Used  . Alcohol use No  . Drug use: No  . Sexual activity: Yes   Other Topics Concern  . Not on file   Social History Narrative  . No narrative on file    Medications: Prior to Admission medications   Medication Sig Start Date End Date Taking? Authorizing Provider  Prenatal Vit-Fe Fumarate-FA (MULTIVITAMIN-PRENATAL) 27-0.8 MG TABS tablet Take 1 tablet by mouth daily at 12 noon.    Historical Provider, MD    Allergies: No Known Allergies  Physical Exam: Vitals:  Temperature 98.3 F (36.8 C).  Urine Dip Protein: N/A  FHT: A:140 B:150 I do not have a contiguous tracing to evaluate, patient unable to tolerate laying flat because of discomfort Toco: initially no contractions but once repositioning toco has definite uterine irritability  General: NAD HEENT: normocephalic, anicteric Pulmonary: No increased work of breathing Cardiovascular: RRR, distal pulses 2+ Abdomen: Gravid, non-tender Leopolds: bedside US A Vtx B Breech Genitourinary: 1.5/70/-2 Extremities: no edema, erythema, or tenderness Neurologic: Grossly intact Psychiatric: mood appropriate, affect full  Labs: Results for orders placed or performed during the hospital encounter of 08/13/16 (from the past 24 hour(s))  Urinalysis, Complete w Microscopic     Status: Abnormal   Collection Time: 08/13/16  4:07 PM  Result Value Ref Range   Color, Urine YELLOW (A) YELLOW   APPearance CLEAR (A) CLEAR   Specific Gravity, Urine 1.008 1.005 - 1.030   pH 6.0 5.0 - 8.0   Glucose, UA NEGATIVE NEGATIVE mg/dL   Hgb urine dipstick NEGATIVE NEGATIVE   Bilirubin Urine NEGATIVE NEGATIVE   Ketones, ur NEGATIVE NEGATIVE mg/dL   Protein, ur NEGATIVE NEGATIVE mg/dL   Nitrite NEGATIVE NEGATIVE   Leukocytes, UA NEGATIVE NEGATIVE   RBC / HPF 0-5 0 - 5 RBC/hpf   WBC, UA 0-5 0 - 5 WBC/hpf   Bacteria, UA NONE SEEN NONE SEEN   Squamous Epithelial / LPF 6-30 (A) NONE SEEN   Mucous  PRESENT    ----------------------------------------------------------------------  OBSTETRICS REPORT                      (Signed Final 08/03/2016 10:12 am) ---------------------------------------------------------------------- PATIENT INFO:  ID #:       [id number]                          D.O.B.:  10-06-1985 (31 yrs)  Name:       Chloe Kelley                 Visit Date: 08/03/2016 10:00 am ---------------------------------------------------------------------- PERFORMED BY:  Performed By:     Bolivar Haw          Ref.  Address:      [address], [place],                                                              [place] [address]  Referred By:      [name] MD ---------------------------------------------------------------------- SERVICE(S) PROVIDED:   [place] MFM OB LIMITED                                     [phone]  ---------------------------------------------------------------------- INDICATIONS:   [redacted] weeks gestation of pregnancy                Z3A.27   Monochorionic Diamniotic twins (TTTS   check)  ---------------------------------------------------------------------- FETAL EVALUATION (FETUS A):  Num Of Fetuses:     2  Preg. Location:     Right uterus  Fetal Heart         147  Rate(bpm):  Cardiac Activity:   Present  Presentation:       Vertex  Placenta:           Anterior                              Largest Pocket(cm)                              6.2 ---------------------------------------------------------------------- GESTATIONAL AGE (FETUS A):  LMP:           27w 3d        Date:  01/24/16                 EDD:   10/30/16  Best:          27w 3d     Det. By:  LMP  (01/24/16)          EDD:   10/30/16 ---------------------------------------------------------------------- ANATOMY (FETUS A):  Stomach:               Within Normal Limits   Bladder:  Within Normal Limits ---------------------------------------------------------------------- FETAL EVALUATION (FETUS B):  Num Of Fetuses:     2  Preg. Location:     Left uterus  Fetal Heart         149  Rate(bpm):  Cardiac Activity:   Present  Presentation:       Vertex  Placenta:           Anterior                              Largest Pocket(cm)                              4.9 ---------------------------------------------------------------------- GESTATIONAL AGE (FETUS B):  LMP:           27w 3d        Date:  01/24/16                 EDD:   10/30/16  Best:           27w 3d     Det. By:  LMP  (01/24/16)          EDD:   10/30/16 ---------------------------------------------------------------------- ANATOMY (FETUS B):  Stomach:               Within Normal Limits   Bladder:                Within Normal Limits ---------------------------------------------------------------------- IMPRESSION:  Intrauterine twin gestation with a best estimated gestational  age of [redacted]weeks 3 days.  Dating is based on LMP consistent  with earliest available ultrasound performed at [facility] on 03/17/2016; measurements were reported as 7  weeks 1 day.  Exam limited to evaluation for TTTS/hydrops check.  Twin A:  right uterus, cephalic, anterior placenta.  MVP 6.2  cm.  No evidence of TTTS.  Twin B:  left uterus, cephalic, anterior placenta. MVP 4.9 cm.  No evidence of TTTS.  Follow up ultrasound for twin growth and TTTS check already  scheduled in 2 weeks.  Fetal echocardiograms did not get scheduled - will schedule  today. ----------------------------------------------------------------------                  Kirby FunkSarah Ellestad, MD Electronically Signed Final Report   08/03/2016 10:12 am----------------------------------------------------------------------  OBSTETRICS REPORT                      (Signed Final 08/03/2016 10:12 am) ---------------------------------------------------------------------- PATIENT INFO:  ID #:       [id number]                          D.O.B.:  August 30, 1985 (31 yrs)  Name:       Chloe Kelley                 Visit Date: 08/03/2016 10:00 am ---------------------------------------------------------------------- PERFORMED BY:  Performed By:     Bolivar HawParker Sumner          Ref. Address:      [address], [place],                                                              [place] [address]  Referred By:      [name]  MD ---------------------------------------------------------------------- SERVICE(S) PROVIDED:   [place] MFM OB LIMITED                                     [phone]  ---------------------------------------------------------------------- INDICATIONS:   [redacted] weeks gestation of pregnancy                Z3A.27   Monochorionic Diamniotic twins (TTTS   check)  ---------------------------------------------------------------------- FETAL EVALUATION (FETUS A):  Num Of Fetuses:     2  Preg. Location:     Right uterus  Fetal Heart         147  Rate(bpm):  Cardiac Activity:   Present  Presentation:       Vertex  Placenta:           Anterior                              Largest Pocket(cm)                              6.2 ---------------------------------------------------------------------- GESTATIONAL AGE (FETUS A):  LMP:           27w 3d        Date:  01/24/16                 EDD:   10/30/16  Best:          27w 3d     Det. By:  LMP  (01/24/16)          EDD:   10/30/16 ---------------------------------------------------------------------- ANATOMY (FETUS A):  Stomach:               Within Normal Limits   Bladder:                Within Normal Limits ---------------------------------------------------------------------- FETAL EVALUATION (FETUS B):  Num Of Fetuses:     2  Preg. Location:     Left uterus  Fetal Heart         149  Rate(bpm):  Cardiac Activity:   Present  Presentation:       Vertex  Placenta:           Anterior                              Largest Pocket(cm)                              4.9 ---------------------------------------------------------------------- GESTATIONAL AGE (FETUS B):  LMP:           27w 3d        Date:  01/24/16                 EDD:   10/30/16  Best:  27w 3d     Det. By:  LMP  (01/24/16)          EDD:   10/30/16 ---------------------------------------------------------------------- ANATOMY (FETUS B):  Stomach:               Within Normal Limits    Bladder:                Within Normal Limits ---------------------------------------------------------------------- IMPRESSION:  Intrauterine twin gestation with a best estimated gestational  age of [redacted]weeks 3 days.  Dating is based on LMP consistent  with earliest available ultrasound performed at [facility] on 03/17/2016; measurements were reported as 7  weeks 1 day.  Exam limited to evaluation for TTTS/hydrops check.  Twin A:  right uterus, cephalic, anterior placenta.  MVP 6.2  cm.  No evidence of TTTS.  Twin B:  left uterus, cephalic, anterior placenta. MVP 4.9 cm.  No evidence of TTTS.  Follow up ultrasound for twin growth and TTTS check already  scheduled in 2 weeks.  Fetal echocardiograms did not get scheduled - will schedule  today. ----------------------------------------------------------------------                  Kirby Funk, MD Electronically Signed Final Report   08/03/2016 10:12 am  Assessment: 31 y.o. G2P0 [redacted]w[redacted]d by LMP=7 week [place] derived EDC of 10/30/2016, with Mo/Di twins threatened preterm labor  Plan: 1) Possible preterm labor - given gestational age will transfer to facility with appropriate level NICU.  We discussed that she may not change her cervix and be discharged after receiving second dose of betamethasone.  Alternative is to remain here at [facility] but should delivery be necessary infant would be stabilized then transferred.  She was planning on a repeat C-section but I discussed that C-section would be the preferred mode of delivery at this gestational age and weight given the risk of head entrapment for the second twin.   - spoke with Dr. Kathrine Haddock who accepted patient in transfer  2) Fetus - - magnesium sulfate for CP ppx 4g load 2g an hour - BMZ started - start ampicillin for gbs unknown (2010 CDC Guidelines "Prevention of Perinatal Group B Streptococcal Disease")  3) PNL -O pos / ABSC neg / RI / VZI / RPR NR / HIV neg / Declined genetic testing /  1-hr 117 / GBS unkown  4) TDAP - has not received antepartum  5) Disposition -

## 2016-08-13 NOTE — Plan of Care (Signed)
Pt started on mag sulfate 4gm bolus , then 2gm/hour

## 2016-08-13 NOTE — Telephone Encounter (Signed)
Pt called stating she is 29wks c twins.  She was seen yesterday and c/o severe pelvic floor pain.  Today the pain is worse, more localized to the right side, unbearable, doubling her over.  Adv to go to L&D for monitoring and to enter via ED.  L&D notified.

## 2016-08-13 NOTE — Op Note (Signed)
Preoperative Diagnosis: 1) 31 y.o. G2P1001 at [redacted]w[redacted]d 2) Monochorionic diamniotic twin pregnancy 3) Non-reassuring fetal surveillance 4) Preterm labor  Postoperative Diagnosis: 1) 31 y.o. [id number] at [redacted]w[redacted]d 2) 2) Monochorionic diamniotic twin pregnancy 3) Non-reassuring fetal surveillance 4) Preterm labor  Operation Performed: Repeat low transverse C-section via pfannenstiel skin incision.    Indiciation: Patient presented with worsening abdominal pain and uterine irritability.  Progressed from 1.5/70/-2 to 2.5/90/-2, with worsening abdominal pain within 45 minutes.  Baby B had a deceleration to 80's with very slow return to baseline with prompted emergent C-section  Anesthesia: Patient presented with worsening abdominal pain and uterine irritability.  Progressed from 1.5/70/-2 to 2.5/90/-2, with worsening abdominal pain within 45 minutes.  Baby B had a deceleration to 80's with very slow return to baseline with prompted emergent C-section  Primary Surgeon: Vena Austria, MD   Preoperative Antibiotics: 2g ancef   Estimated Blood Loss:  IV Fluids: 1L  Urine Output:: pending  Drains or Tubes: Foley to gravity drainage, ON-Q catheter system  Implants: none  Specimens Removed: none  Complications: none  Intraoperative Findings:  Normal tubes ovaries and uterus.  Delivery resulted in the birth of two female infants, taken to NICU for stabilization. Of note baby A had a significantly larger amount of fluid compared to baby B.    Latoshia, Monrroy [[id number]]    Yessica, Putnam [[id number]]    Sanaiyah, Kirchhoff [[id number]]    APGAR (5 MINS):    Abbagayle, Zaragoza [[id number]]    Denya, Buckingham [[id number]]    Lovena, Kluck [[id number]]    APGAR (10 MINS):    Shaneisha, Burkel [[id number]]    Joshlynn, Alfonzo [[id number]]    Edwina, Grossberg [[id number]]   , weight    Patient Condition:stable  Procedure in Detail:  Patient was  taken to the operating room were she was administered regional anesthesia.  She was positioned in the supine position, prepped and draped in the  Usual sterile fashion.  Prior to proceeding with the case a time out was performed and the level of anesthetic was checked and noted to be adequate.  Utilizing the scalpel a pfannenstiel skin incision was made 2cm above the pubic symphysis utilizing the patient's pre-existing scar and carried down sharply to the the level of the rectus fascia.  The fascia was incised in the midline using the scalpel and then extended using manual traction.  The midline was identified, the peritoneum was entered bluntly and expanded using manual tractions.  The uterus was noted to be in a none rotated position.  Next the bladder blade was placed retracting the bladder caudally.  A bladder flap was not created.  A low transverse incision was scored on the lower uterine segment.  The hysterotomy was entered bluntly using the operators finger.  The hysterotomy incision was extended using manual traction.  The operators hand was placed within the hysterotomy position noting the fetus to be within the vtx position.  The vertex was grasped, flexed, brought to the incision, and delivered a traumatically using fundal pressure.  The remainder of the body delivered with ease.  A significant amount of fluid was noted at the time of amniotomy.  B presented vtx was delivered en caul, almost no fluid noted for B. The infant was suctioned, cord was clamped and cut before handing off to the awaiting neonatologist.  The placenta was delivered using manual extraction.  The uterus was exteriorized, wiped clean of clots and debris using two  moist laps.  The hysterotomy was closed using a two layer closure of 0 Vicryl, with the first being a running locked, the second a vertical imbricating.  The uterus was returned to the abdomen.  The peritoneal gutters were wiped clean of clots and debris using two moist  laps.  The hysterotomy incision was re-inspected noted to be hemostatic.  The rectus muscles were re-approximated in the midline using a single 2-0 Vicryl mattress stitch.  The rectus muscles were inspected noted to be hemostatic.  The superior border of the rectus fascia was grasped with a Kocher clamp.  The ON-Q trocars were then placed 4cm above the superior border of the incision and tunneled subfascially.  The introducers were removed and the catheters were threaded through the sleeves after which the sleeves were removed.  The fascia was closed using a looped #1 PDS in a running fashion taking 1cm by 1cm bites.  The subcutaneous tissue was irrigated using warm saline, hemostasis achieved using the bovie.  The subcutaneous dead space was less than 3cm and was not closed.  The skin was closed using 4-0 monocryl.  Sponge needle and instrument counts were corrects times two.  The patient tolerated the procedure well and was taken to the recovery room in stable condition.

## 2016-08-13 NOTE — Anesthesia Preprocedure Evaluation (Signed)
Anesthesia Evaluation  Patient identified by MRN, date of birth, ID band Patient awake    Reviewed: Allergy & Precautions, H&P , NPO status , Patient's Chart, lab work & pertinent test results  History of Anesthesia Complications Negative for: history of anesthetic complications  Airway Mallampati: III  TM Distance: >3 FB Neck ROM: full    Dental  (+) Chipped   Pulmonary neg pulmonary ROS,    Pulmonary exam normal breath sounds clear to auscultation       Cardiovascular (-) hypertensionnegative cardio ROS Normal cardiovascular exam Rhythm:regular Rate:Normal     Neuro/Psych negative neurological ROS  negative psych ROS   GI/Hepatic negative GI ROS, Neg liver ROS,   Endo/Other  negative endocrine ROS  Renal/GU      Musculoskeletal   Abdominal   Peds  Hematology negative hematology ROS (+)   Anesthesia Other Findings Past Medical History: No date: Medical history non-contributory  Past Surgical History: No date: CESAREAN SECTION     Reproductive/Obstetrics (+) Pregnancy                             Anesthesia Physical Anesthesia Plan  ASA: II and emergent  Anesthesia Plan: General ETT   Post-op Pain Management:    Induction:   Airway Management Planned:   Additional Equipment:   Intra-op Plan:   Post-operative Plan:   Informed Consent: I have reviewed the patients History and Physical, chart, labs and discussed the procedure including the risks, benefits and alternatives for the proposed anesthesia with the patient or authorized representative who has indicated his/her understanding and acceptance.   Dental Advisory Given  Plan Discussed with: Anesthesiologist, CRNA and Surgeon  Anesthesia Plan Comments: (Patient brought emergently to OR for stat c section.  Patient consented in the OR by MD)        Anesthesia Quick Evaluation

## 2016-08-13 NOTE — Discharge Summary (Signed)
Obstetric Discharge Summary  Reason for Admission: preterm labor, fetal heart rate deceleration Prenatal Procedures: none Intrapartum Procedures: cesarean: low cervical, transverse Postpartum Procedures: none Complications-Operative and Postpartum: none   Hemoglobin  Date Value Ref Range Status  08/14/2016 10.2 (L) 12.0 - 16.0 g/dL Final   HCT  Date Value Ref Range Status  08/14/2016 29.9 (L) 35.0 - 47.0 % Final   Hematocrit  Date Value Ref Range Status  07/29/2016 28.2 (L) 34.0 - 46.6 % Final    Physical Exam:  General: alert, cooperative, appears stated age and no distress Lochia: appropriate Uterine Fundus: firm Incision: healing well DVT Evaluation: No evidence of DVT seen on physical exam.  Discharge Diagnoses: Pre-Term Pregnancy-delivered cesarean section  Discharge Information: Post Op Day 1 cesarean section Date: 08/14/2016 Activity: pelvic rest Diet: routine Allergies as of 08/14/2016   No Known Allergies     Medication List    TAKE these medications   multivitamin-prenatal 27-0.8 MG Tabs tablet Take 1 tablet by mouth daily at 12 noon.       Condition: stable Instructions: refer to practice specific booklet and verbal instructions given at discharge [facility] to be in same facility as newborns Follow-up Information    Chloe Libraobert Paul Harris, MD. Go on 08/19/2016.   Specialty:  Obstetrics and Gynecology Why:  Postpartum follow up appointment on Wednesday March 28th at 2:30 PM for an incision check Contact information: [address] [place] [address] [phone]          Newborn Data:   Chloe Kelley, GirlA Emmery [[id number]][[id number]]  Live born female  Birth Weight: 2 lb 3.6 oz (1010 g) APGAR: ,    Chloe Kelley, GirlB Rayleen [[id number]][[id number]]  Live born female  Birth Weight: 1 lb 14.3 oz (860 g) APGAR: ,    [facility]   Chloe Kelley, CNM 08/14/2016, 4:27 PM

## 2016-08-13 NOTE — Transfer of Care (Signed)
Immediate Anesthesia Transfer of Care Note  Patient: Lyn RecordsSarah C Sofranko  Procedure(s) Performed: Procedure(s): CESAREAN SECTION MULTI-GESTATIONAL (N/A)  Patient Location: PACU  Anesthesia Type:General  Level of Consciousness: awake and alert   Airway & Oxygen Therapy: Patient Spontanous Breathing and Patient connected to face mask oxygen  Post-op Assessment: Report given to RN and Post -op Vital signs reviewed and stable  Post vital signs: Reviewed  Last Vitals:  Vitals:   08/13/16 1955 08/13/16 1956  BP: 107/66 107/66  Pulse: 83 85  Resp: 15 14  Temp: (!) 36 C (!) 36.1 C    Last Pain:  Vitals:   08/13/16 1630  PainSc: 9          Complications: No apparent anesthesia complications

## 2016-08-13 NOTE — Plan of Care (Signed)
[place] for baby a and baby b taken off due to pt only able to sit in 90 degree fowler's position to be comfortable. toco remains for contractions.

## 2016-08-13 NOTE — Plan of Care (Signed)
Dr Bonney Aidstaebler in room discussing options with pt and pt's husband since pt is dilated to 1.5cm

## 2016-08-13 NOTE — Anesthesia Postprocedure Evaluation (Signed)
Anesthesia Post Note  Patient: Chloe Kelley  Procedure(s) Performed: Procedure(s) (LRB): CESAREAN SECTION MULTI-GESTATIONAL (N/A)  Patient location during evaluation: PACU Anesthesia Type: General Level of consciousness: awake and alert Pain management: pain level controlled Vital Signs Assessment: post-procedure vital signs reviewed and stable Respiratory status: spontaneous breathing, nonlabored ventilation, respiratory function stable and patient connected to nasal cannula oxygen Cardiovascular status: blood pressure returned to baseline and stable Postop Assessment: no signs of nausea or vomiting Anesthetic complications: no     Last Vitals:  Vitals:   08/13/16 2035 08/13/16 2040  BP: 115/77   Pulse: 70 67  Resp: 18 17  Temp:      Last Pain:  Vitals:   08/13/16 1630  PainSc: 9                  Marcellis Frampton K Breahna Boylen

## 2016-08-13 NOTE — Progress Notes (Signed)
Rechecked cervix because patient hurting worse and now 2.5/90/-1 with fetal bradycardia to 80's had had slow rise back to baseline 140-150 over 8 minutes.  Given making cervical change, worsening pain, and prolonged decel on baby B will proceed with stat primary C-section.

## 2016-08-14 ENCOUNTER — Encounter: Payer: Self-pay | Admitting: Obstetrics and Gynecology

## 2016-08-14 LAB — CBC
HEMATOCRIT: 29.9 % — AB (ref 35.0–47.0)
HEMOGLOBIN: 10.2 g/dL — AB (ref 12.0–16.0)
MCH: 30.7 pg (ref 26.0–34.0)
MCHC: 34 g/dL (ref 32.0–36.0)
MCV: 90.3 fL (ref 80.0–100.0)
Platelets: 290 10*3/uL (ref 150–440)
RBC: 3.31 MIL/uL — ABNORMAL LOW (ref 3.80–5.20)
RDW: 12.8 % (ref 11.5–14.5)
WBC: 21.4 10*3/uL — ABNORMAL HIGH (ref 3.6–11.0)

## 2016-08-14 LAB — RPR: RPR Ser Ql: NONREACTIVE

## 2016-08-14 NOTE — Progress Notes (Signed)
[facility] is now [facility]. Awaiting MD acceptance of patient.    Tresea MallJane Zyan Coby, CNM

## 2016-08-14 NOTE — Progress Notes (Signed)
Admit Date: 08/13/2016 Today's Date: 08/14/2016  Subjective: Postpartum Day 1: Cesarean Delivery for twins w labor/fetal intolerence Patient reports incisional pain and tolerating PO.    Objective: Vital signs in last 24 hours: Temp:  [96.8 F (36 C)-99.1 F (37.3 C)] 98.5 F (36.9 C) (03/23 0445) Pulse Rate:  [56-89] 62 (03/23 0445) Resp:  [14-23] 18 (03/23 0445) BP: (92-117)/(47-78) 101/60 (03/23 0445) SpO2:  [97 %-100 %] 97 % (03/23 0445)  Physical Exam:  General: alert, cooperative and no distress Lochia: appropriate Uterine Fundus: firm Incision: healing well DVT Evaluation: No evidence of DVT seen on physical exam. Negative Homan's sign.   Recent Labs  08/13/16 1829 08/14/16 0527  HGB 11.4* 10.2*  HCT 33.2* 29.9*    Assessment/Plan: Status post Cesarean section. Doing well postoperatively.  Continue current care. Infants doing well after transfer to [facility] last night Pt wishes to be transferrred to [facility] to be close to them.  She is stable to do so.  WIll call soon to see to this.  Letitia Libraobert Paul Tadeusz Stahl 08/14/2016, 7:34 AM

## 2016-08-14 NOTE — Progress Notes (Signed)
Discharge/transfer order received from MD. Reviewed discharge instructions and On- Q pump removal instructions with patient and answered any questions. Patient verbalized understanding. Report given to, Sherrlyn HockLisa Cipriano, with [facility]. EMTALA form, face sheet, medical necessity transport form, and full transfer report given to Sherrlyn HockLisa Cipriano with [facility]. Patients vitals taken before transfer and were stable. [facility].     Oswald HillockAbigail Garner, RN

## 2016-08-14 NOTE — Discharge Instructions (Signed)
Please call your doctor or return to the ER if you experience any chest pains, shortness of breath, dizziness, visual changes, fever greater than 101, any heavy bleeding (saturating more than 1 pad per hour), large clots, or foul smelling discharge, any worsening abdominal pain and cramping that is not controlled by pain medication, or any signs of postpartum depression. No tampons, enemas, douches, or sexual intercourse for 6 weeks. Also avoid tub baths, hot tubs, or swimming for 6 weeks.   Check your incision daily for any signs of infection such as warmth, redness, increased pain, swelling, pus, or foul smelling drainage, etc.

## 2016-08-14 NOTE — Lactation Note (Signed)
Lactation Consultation Note  Patient Name: Chloe Kelley RecordsSarah C Pogorzelski ZOXWR'UToday's Date: 08/14/2016     Maternal Data  Mom pumping breasts every 3 hrs and obtaining drops but not enough to refrigerate, able to clean pump pieces well, [facility], has a Medela PNS to use at home and also can obtain a pump through her insurance.   Feeding    LATCH Score/Interventions                      Lactation Tools Discussed/Used     Consult Status      Dyann KiefMarsha D Zuriyah Shatz 08/14/2016, 3:47 PM

## 2016-08-17 ENCOUNTER — Ambulatory Visit: Payer: BC Managed Care – PPO

## 2016-08-17 LAB — SURGICAL PATHOLOGY

## 2016-08-19 ENCOUNTER — Encounter: Payer: BC Managed Care – PPO | Admitting: Obstetrics & Gynecology

## 2016-08-19 ENCOUNTER — Telehealth: Payer: Self-pay

## 2016-08-19 NOTE — Telephone Encounter (Signed)
FMLA/DISABILITY form for [employer] filled out [facility]wks [facility] and given to [place] for processing.

## 2016-08-26 ENCOUNTER — Encounter: Payer: BC Managed Care – PPO | Admitting: Obstetrics and Gynecology

## 2016-08-28 ENCOUNTER — Encounter: Payer: BC Managed Care – PPO | Admitting: Obstetrics and Gynecology

## 2016-09-14 ENCOUNTER — Telehealth: Payer: Self-pay

## 2016-09-14 ENCOUNTER — Ambulatory Visit (INDEPENDENT_AMBULATORY_CARE_PROVIDER_SITE_OTHER): Payer: BC Managed Care – PPO | Admitting: Obstetrics & Gynecology

## 2016-09-14 ENCOUNTER — Ambulatory Visit: Payer: BC Managed Care – PPO

## 2016-09-14 ENCOUNTER — Encounter: Payer: Self-pay | Admitting: Obstetrics & Gynecology

## 2016-09-14 VITALS — BP 100/60 | Ht 68.0 in | Wt 146.0 lb

## 2016-09-14 DIAGNOSIS — N61 Mastitis without abscess: Secondary | ICD-10-CM

## 2016-09-14 MED ORDER — AMOXICILLIN 500 MG PO TABS: 500.0000 mg | ORAL_TABLET | Freq: Two times a day (BID) | ORAL | 0 refills | Status: DC

## 2016-09-14 MED ORDER — FLUCONAZOLE 150 MG PO TABS: 150.0000 mg | ORAL_TABLET | Freq: Once | ORAL | 2 refills | Status: AC

## 2016-09-14 NOTE — Patient Instructions (Signed)
Breastfeeding and Mastitis  Mastitis is inflammation of the breast tissue. It can occur in women who are breastfeeding. This can make breastfeeding painful. Mastitis will sometimes go away on its own, especially if it is not caused by an infection (non-infectious mastitis). Your health care provider will help determine if medical treatment is needed. Treatment may be needed if the condition is caused by a bacterial infection (infectious mastitis).  What are the causes?  This condition is often associated with a blocked milkduct, which can happen when too much milk builds up in the breast. Causes of excess milk in the breast can include:  · Poor latch-on. If your baby is not latched onto the breast properly, he or she may not empty your breast completely while breastfeeding.  · Allowing too much time to pass between feedings.  · Wearing a bra or other clothing that is too tight. This puts extra pressure on the milk ducts so milk does not flow through them as it should.  · Milk remaining in the breast because it is overfilled (engorged).  · Stress and fatigue.    Mastitis can also be caused by a bacterial infection. Bacteria may enter the breast tissue through cuts, cracks, or openings in the skin near the nipple area. Cracks in the skin are often caused when your baby does not latch on properly to the breast.  What are the signs or symptoms?  Symptoms of this condition include:  · Swelling, redness, tenderness, and pain in an area of the breast. This usually affects the upper part of the breast, toward the armpit region. In most cases, it affects only one breast. In some cases, it may occur on both breasts at the same time and affect a larger portion of breast tissue.  · Swelling of the glands under the arm on the same side.  · Fatigue, headache, and flu-like muscle aches.  · Fever.  · Rapid pulse.    Symptoms usually last 2 to 5 days. Breast pain and redness are at their worst on day 2 and day 3, and they usually go  away by day 5. If an infection is left to progress, a collection of pus (abscess) may develop.  How is this diagnosed?  This condition can be diagnosed based on your symptoms and a physical exam. You may also have tests, such as:  · Blood tests to determine if your body is fighting a bacterial infection.  · Mammogram or ultrasound tests to rule out other problems or diseases.  · Fluid tests. If an abscess has developed, the fluid in the abscess may be removed with a needle. The fluid may be analyzed to determine if bacteria are present.  · Breast milk may be cultured and tested for bacteria.    How is this treated?  This condition will sometimes go away on its own. Your health care provider may choose to wait 24 hours after first seeing you to decide whether treatment is needed. If treatment is needed, it may include:  · Strategies to manage breastfeeding. This includes continuing to breastfeed or pump in order to allow adequate milk flow, using breast massage, and applying heat or cold to the affected area.  · Self-care such as rest and increased fluid intake.  · Medicine for pain.  · Antibiotic medicine to treat a bacterial infection. This is usually taken by mouth.  · If an abscess has developed, it may be treated by removing fluid with a needle.      Follow these instructions at home:  Medicines  · Take over-the-counter and prescription medicines only as told by your health care provider.  · If you were prescribed an antibiotic medicine, take it as told by your health care provider. Do not stop taking the antibiotic even if you start to feel better.  General instructions  · Do not wear a tight or underwire bra. Wear a soft, supportive bra.  · Increase your fluid intake, especially if you have a fever.  · Get plenty of rest.  For breastfeeding:  · Continue to empty your breasts as often as possible, either by breastfeeding or using an electric breast pump. This will lower the pressure and the pain that comes with  it. Ask your health care provider if changes need to be made to your breastfeeding or pumping routine.  · Keep your nipples clean and dry.  · During breastfeeding, empty the first breast completely before going to the other breast. If your baby is not emptying your breasts completely, use a breast pump to empty your breasts.  · Use breast massage during feeding or pumping sessions.  · If directed, apply moist heat to the affected area of your breast right before breastfeeding or pumping. Use the heat source that your health care provider recommends.  · If directed, put ice on the affected area of your breast right after breastfeeding or pumping:  ? Put ice in a plastic bag.  ? Place a towel between your skin and the bag.  ? Leave the ice on for 20 minutes.  · If you go back to work, pump your breasts while at work to stay in time with your nursing schedule.  · Do not allow your breasts to become engorged.  Contact a health care provider if:  · You have pus-like discharge from the breast.  · You have a fever.  · Your symptoms do not improve within 2 days of starting treatment.  · Your symptoms return after you have recovered from a breast infection.  Get help right away if:  · Your pain and swelling are getting worse.  · You have pain that is not controlled with medicine.  · You have a red line extending from the breast toward your armpit.  Summary  · Mastitis is inflammation of the breast tissue. It is often caused by a blocked milk duct or bacteria.  · This condition may be treated with hot and cold compresses, medicines, self-care, and certain breastfeeding strategies.  · If you were prescribed an antibiotic medicine, take it as told by your health care provider. Do not stop taking the antibiotic even if you start to feel better.  · Continue to empty your breasts as often as possible either by breastfeeding or using an electric breast pump.  This information is not intended to replace advice given to you by your  health care provider. Make sure you discuss any questions you have with your health care provider.  Document Released: 09/05/2004 Document Revised: 05/12/2016 Document Reviewed: 05/12/2016  Elsevier Interactive Patient Education © 2017 [employer]

## 2016-09-14 NOTE — Telephone Encounter (Signed)
Pt calling today with mastitis symptoms. Body aches, tender breasts. Please schedule an appt as soon as possible for pt. Thank you

## 2016-09-14 NOTE — Progress Notes (Signed)
HPI:      Chloe Kelley is a 31 y.o. G2P0101 who is 4 weeks PostPartum, presents today for a problem visit.  She complains of skin color change and soreness on the rightside which she first noticed yesterday.  It has not significantly changed.  Associated symptoms include no known fever although she had episode of sweating last night.  Denies nipple discharge other than lactation she is pumping for her twins.  No fever. Prior breast problems: No Family History: Breast Cancer-relatedfamily history is not on file.  PMHx: She  has a past medical history of Medical history non-contributory. Also,  has a past surgical history that includes Cesarean section and Cesarean section multi-gestational (N/A, 08/13/2016)., family history is not on file.,  reports that she has never smoked. She has never used smokeless tobacco. She reports that she does not drink alcohol or use drugs.  She has a current medication list which includes the following prescription(s): amoxicillin and fluconazole. Also, has No Known Allergies.  Review of Systems  Constitutional: Negative for chills, fever and malaise/fatigue.  HENT: Negative for congestion, sinus pain and sore throat.   Eyes: Negative for blurred vision and pain.  Respiratory: Negative for cough and wheezing.   Cardiovascular: Negative for chest pain and leg swelling.  Gastrointestinal: Negative for abdominal pain, constipation, diarrhea, heartburn, nausea and vomiting.  Genitourinary: Negative for dysuria, frequency, hematuria and urgency.  Musculoskeletal: Negative for back pain, joint pain, myalgias and neck pain.  Skin: Negative for itching and rash.  Neurological: Negative for dizziness, tremors and weakness.  Endo/Heme/Allergies: Does not bruise/bleed easily.  Psychiatric/Behavioral: Negative for depression. The patient is not nervous/anxious and does not have insomnia.     Objective: BP 100/60   Ht  (1.727 m)   Wt 146 lb (66.2 kg)   SpO2  98%   BMI 22.20 kg/m  Physical Exam  Constitutional: She is oriented to person, place, and time. She appears well-developed and well-nourished. No distress.  Cardiovascular: Normal rate, regular rhythm, normal heart sounds and normal pulses.  Exam reveals no gallop and no friction rub.   No murmur heard. Pulmonary/Chest: Effort normal and breath sounds normal. She exhibits no mass, no tenderness and no edema. Right breast exhibits skin change and tenderness. Right breast exhibits no inverted nipple, no mass and no nipple discharge. Left breast exhibits no inverted nipple, no mass, no nipple discharge, no skin change and no tenderness. There is no breast swelling.  Some erythema to inner side of right breast No mass  Abdominal: Normal appearance.  Musculoskeletal: Normal range of motion.  Lymphadenopathy:    She has no axillary adenopathy.       Right axillary: No pectoral and no lateral adenopathy present.       Left axillary: No pectoral and no lateral adenopathy present. Neurological: She is alert and oriented to person, place, and time.  Skin: Skin is warm and dry. No abrasion, no bruising, no lesion and no rash noted. No erythema.  Psychiatric: She has a normal mood and affect. Her speech is normal and behavior is normal. Judgment normal.  Vitals reviewed.  ASSESSMENT/PLAN: mastitis 1. Mastitis - amoxicillin (AMOXIL) 500 MG tablet; Take 1 tablet (500 mg total) by mouth 2 (two) times daily.  Dispense: 14 tablet; Refill: 0 - fluconazole (DIFLUCAN) 150 MG tablet; Take 1 tablet (150 mg total) by mouth once. May repeat in 7 days if symptoms persist  Dispense: 1 tablet; Refill: 2 - warm compresses, continue pumping  [name], MD, [name], [facility] 09/14/2016  11:51 AM

## 2016-09-18 ENCOUNTER — Ambulatory Visit: Payer: Self-pay

## 2016-09-18 NOTE — Lactation Note (Signed)
This note was copied from a baby's chart. Lactation Consultation Note  Patient Name: Chloe Kelley ZOXWR'U Date: 09/18/2016     Maternal Data  Mom has baby at right breast with nipple at mouth, baby occ. Opens mouth, had mastitis in this breast, continues to pump every 2-3 hrs, finishes antibx soon, milk production decreased on this side at present  Feeding Feeding Type: Breast Milk Length of feed: 90 min  LATCH Score/Interventions                      Lactation Tools Discussed/Used     Consult Status      [name] 09/18/2016, 5:22 PM

## 2016-09-18 NOTE — Lactation Note (Signed)
This note was copied from a baby's chart. Lactation Consultation Note  Patient Name: Chloe Kelley Today's Date: 09/18/2016     Maternal Data  Mom has baby at right breast with nipple at mouth while milk infusing per gavage, baby sleepy, not opening mouth much while at breast, mom has had some leaking while baby at breast, which is encouraging as she has had decreased milk production because of mastitis.  Feeding Feeding Type: Breast Milk Length of feed: 90 min  LATCH Score/Interventions                      Lactation Tools Discussed/Used     Consult Status      [name] 09/18/2016, 5:28 PM

## 2016-09-25 ENCOUNTER — Ambulatory Visit (INDEPENDENT_AMBULATORY_CARE_PROVIDER_SITE_OTHER): Payer: BC Managed Care – PPO | Admitting: Obstetrics & Gynecology

## 2016-09-25 ENCOUNTER — Encounter: Payer: Self-pay | Admitting: Obstetrics & Gynecology

## 2016-09-25 DIAGNOSIS — Z30013 Encounter for initial prescription of injectable contraceptive: Secondary | ICD-10-CM

## 2016-09-25 DIAGNOSIS — Z98891 History of uterine scar from previous surgery: Secondary | ICD-10-CM

## 2016-09-25 MED ORDER — MEDROXYPROGESTERONE ACETATE 150 MG/ML IM SUSP: 150.0000 mg | INTRAMUSCULAR | 3 refills | Status: DC

## 2016-09-25 NOTE — Progress Notes (Signed)
  OBSTETRICS POSTPARTUM CLINIC PROGRESS NOTE  Subjective:     Chloe Kelley is a 31 y.o. 242P0103 female who presents for a postpartum visit. She is 6 weeks postpartum following a Preterm pregnancy <37 weeks or Multifetal pregnancy and delivery by C-section repeat; no problems after deliver.  I have fully reviewed the prenatal and intrapartum course. Anesthesia: spinal.  Postpartum course has been complicated by complicated by mastitis.  Baby is feeding by breast.  Bleeding: patient has not  resumed menses.  Bowel function is normal. Bladder function is normal.  Patient is not sexually active. Contraception method desired is Depo-Provera injections.  Postpartum depression screening: negative. Edinburgh 4.  The following portions of the patient's history were reviewed and updated as appropriate: allergies, current medications, past family history, past medical history, past social history, past surgical history and problem list.  Review of Systems Pertinent items noted in HPI and remainder of comprehensive ROS otherwise negative.  Objective:    BP 114/70   Ht 5\' 8"  (1.727 m)   Wt 148 lb (67.1 kg)   BMI 22.50 kg/m   General:  alert and no distress   Breasts:  inspection negative, no nipple discharge or bleeding, no masses or nodularity palpable  Lungs: clear to auscultation bilaterally  Heart:  regular rate and rhythm, S1, S2 normal, no murmur, click, rub or gallop  Abdomen: soft, non-tender; bowel sounds normal; no masses,  no organomegaly.  Well healed Pfannenstiel incision   Vulva:  normal  Vagina: normal vagina, no discharge, exudate, lesion, or erythema  Cervix:  no cervical motion tenderness and no lesions  Corpus: normal size, contour, position, consistency, mobility, non-tender  Adnexa:  normal adnexa and no mass, fullness, tenderness  Rectal Exam: Not performed.          Assessment:     6 weeks postpartum exam.   TWINS, PREMATURITY  Contraception mgt   Counseled  as to options    Desires Depo  Plan:     See orders and Patient Instructions Contraceptive counseling for Depo-Provera Resume all normal activities Follow up in: 5 months or as needed.  PAP then  Annamarie MajorPaul Harris, MD, [facility], [facility] 09/25/2016  3:25 PM \

## 2016-09-25 NOTE — Patient Instructions (Signed)

## 2016-09-28 ENCOUNTER — Ambulatory Visit: Payer: BC Managed Care – PPO

## 2016-09-29 ENCOUNTER — Ambulatory Visit: Payer: BC Managed Care – PPO

## 2016-10-13 ENCOUNTER — Ambulatory Visit: Payer: Self-pay

## 2016-10-13 ENCOUNTER — Ambulatory Visit: Payer: Self-pay | Admitting: Obstetrics & Gynecology

## 2016-10-13 NOTE — Lactation Note (Signed)
This note was copied from a baby's chart. Lactation Consultation Note  Patient Name: Chloe Kelley [id number]'s Date: 10/13/2016 Reason for consult: Initial assessment Chloe Kelley was already fussy/hungry by the time I got to help her with a breastfeeding session. Mom did a good job of giving her firm support and consoling her. At first . She started in cradle hold. Cammy Copabigail was off/on with her suckling and came off fussy, crying and rooting. It looked like mom struggled a bit to keep good alignment so we changed to football hold. It helped a little, but Chloe Kelley soon got fussy again. Mom tried pumping a few minutes to elicit a milk ejection reflex. It took almost 3 minutes. (Lots of noise in SCN today and Chloe Kelley was crying). She calmed briefly when Mom let her get drops of her milk but then fussed again. We tried a 24 mm nipples shield which helped briefly. We tried these various tactics over 20 minutes, and finally just had MOm hold her snuggly to chest while she received her tube feeding. We discussed how these sessions should go more smoothly for Mom and baby if we can get Chloe Kelley to the breast before she gets this hungry and upset, acknowledging how tricky that is in SCN. PLAN:  Feed when quietly alert and cuing: Try to pre pump to elicit let down prior to feeds, when able: try nipple shield while she is in football hold: insert EBM into shield prn prior to latch. Mom to continue to pump at least 8 times in 24 hours.   Maternal Data Has patient been taught Hand Expression?: Yes  Feeding Feeding Type: Breast Milk Length of feed: 60 min  LATCH Score/Interventions Latch: Repeated attempts needed to sustain latch, nipple held in mouth throughout feeding, stimulation needed to elicit sucking reflex. Intervention(s): Adjust position;Assist with latch;Breast massage (tried nipple shield)  Audible Swallowing: None Intervention(s): Hand expression  Type of Nipple: Everted at rest and after  stimulation (large)  Comfort (Breast/Nipple): Soft / non-tender     Hold (Positioning): Assistance needed to correctly position infant at breast and maintain latch. Intervention(s): Breastfeeding basics reviewed;Support Pillows;Position options;Skin to skin  LATCH Score: 6  Lactation Tools Discussed/Used     Consult Status Consult Status: Follow-up Date: 10/14/16    Sunday CornSandra Clark Luigi Stuckey 10/13/2016, 1:17 PM

## 2016-10-14 ENCOUNTER — Other Ambulatory Visit: Payer: BC Managed Care – PPO

## 2016-10-15 ENCOUNTER — Inpatient Hospital Stay: Admit: 2016-10-15 | Payer: BC Managed Care – PPO | Admitting: Obstetrics & Gynecology

## 2016-10-15 SURGERY — Surgical Case
Anesthesia: Choice

## 2016-12-14 ENCOUNTER — Ambulatory Visit (INDEPENDENT_AMBULATORY_CARE_PROVIDER_SITE_OTHER): Payer: BC Managed Care – PPO | Admitting: Nurse Practitioner

## 2016-12-14 ENCOUNTER — Encounter: Payer: Self-pay | Admitting: Nurse Practitioner

## 2016-12-14 VITALS — BP 99/63 | HR 75 | Temp 98.6°F | Ht 65.75 in | Wt 145.8 lb

## 2016-12-14 DIAGNOSIS — Z30011 Encounter for initial prescription of contraceptive pills: Secondary | ICD-10-CM | POA: Diagnosis not present

## 2016-12-14 DIAGNOSIS — L308 Other specified dermatitis: Secondary | ICD-10-CM | POA: Diagnosis not present

## 2016-12-14 LAB — POCT URINE PREGNANCY: Preg Test, Ur: NEGATIVE

## 2016-12-14 MED ORDER — NORETHINDRONE 0.35 MG PO TABS: 1.0000 | ORAL_TABLET | Freq: Every day | ORAL | 11 refills | Status: DC

## 2016-12-14 MED ORDER — TRIAMCINOLONE ACETONIDE 0.025 % EX OINT: 1.0000 "application " | TOPICAL_OINTMENT | Freq: Two times a day (BID) | CUTANEOUS | 0 refills | Status: DC

## 2016-12-14 NOTE — Progress Notes (Signed)
Subjective:    Patient ID: Chloe Kelley, female    DOB: Sep 16, 1985, 31 y.o.   MRN: [id number]  Chloe Kelley is a 31 y.o. female presenting on 12/14/2016 for Establish Care   HPI Establish Care New Provider Pt last seen by PCP pediatrics many years ago.  GYN for wellness for last 3-4 years.  Obtain records from [place] in last 1 year.   Believes she is finished having children and wants a new PCP rather than seeking care at [facility].   Contraception Management: Pt currently using condoms for contraception.  Pt had thought about Depo provera, but didn't like side effect profile and needs something safe for breastfeeding.  She desires to start OCP - progesterone only during lactation.  She notes timing must be precise and is willing to make that adjustment to her schedule.   Dry skin  Had twin girls in march and has had some skin issues that started after her pregnancy.  Pt notices dry skin under eyes and has a patch under hairline.  The rash under her hairline is intact for a few, then rash or irritation will ooze/scab, heals then repeats the cycle.  Under eyes: Dry skin all the time w/o rash or lesion.   Past Medical History:  Diagnosis Date  . Medical history non-contributory    Past Surgical History:  Procedure Laterality Date  . CESAREAN SECTION    . CESAREAN SECTION MULTI-GESTATIONAL N/A 08/13/2016   Procedure: CESAREAN SECTION MULTI-GESTATIONAL;  Surgeon: Vena Austria, MD;  Location: [facility] ORS;  Service: Obstetrics;  Laterality: N/A;   Social History   Social History  . Marital status: Married    Spouse name: N/A  . Number of children: N/A  . Years of education: N/A   Occupational History  . Not on file.   Social History Main Topics  . Smoking status: Never Smoker  . Smokeless tobacco: Never Used  . Alcohol use No  . Drug use: No  . Sexual activity: Yes   Other Topics Concern  . Not on file   Social History Narrative  . No narrative on file   Family  History  Problem Relation Age of Onset  . Healthy Mother   . Healthy Father   . Healthy Maternal Grandmother   . Dementia Paternal Grandfather   . Breast cancer Neg Hx   . Colon cancer Neg Hx   . Ovarian cancer Neg Hx   . Heart disease Neg Hx   . Stroke Neg Hx   . Diabetes Neg Hx   . Hypertension Neg Hx    Current Outpatient Prescriptions on File Prior to Visit  Medication Sig  . Prenatal Vit-Fe Fumarate-FA (PRENATAL VITAMIN PO) Take by mouth.   No current facility-administered medications on file prior to visit.     Review of Systems  Constitutional: Negative.   HENT: Negative.   Eyes: Negative.   Respiratory: Negative.   Cardiovascular: Negative.   Gastrointestinal: Negative.   Endocrine: Negative.   Genitourinary: Negative.   Musculoskeletal: Negative.   Skin: Positive for rash.  Allergic/Immunologic: Negative.   Neurological: Negative.   Hematological: Negative.   Psychiatric/Behavioral: Negative.    Per HPI unless specifically indicated above      Objective:    BP 99/63 (BP Location: Left Arm, Patient Position: Sitting, Cuff Size: Normal)   Pulse 75   Temp 98.6 F (37 C) (Oral)   Ht 5' 5.75" (1.67 m)   Wt 145 lb 12.8 oz (66.1 kg)  BMI 23.71 kg/m   Wt Readings from Last 3 Encounters:  12/14/16 145 lb 12.8 oz (66.1 kg)  09/25/16 148 lb (67.1 kg)  09/14/16 146 lb (66.2 kg)    Physical Exam  Constitutional: She is oriented to person, place, and time. She appears well-developed and well-nourished. No distress.  HENT:  Head: Normocephalic and atraumatic.  Right Ear: External ear normal.  Left Ear: External ear normal.  Nose: Nose normal.  Mouth/Throat: Oropharynx is clear and moist.  Eyes: Pupils are equal, round, and reactive to light. Conjunctivae are normal.  Neck: Normal range of motion. Neck supple. No JVD present. No tracheal deviation present. No thyromegaly present.  Cardiovascular: Normal rate, regular rhythm, normal heart sounds and intact  distal pulses.   Pulmonary/Chest: Effort normal and breath sounds normal. No respiratory distress.  Abdominal: Soft. Bowel sounds are normal. She exhibits no distension and no mass. There is no tenderness.  Lymphadenopathy:    She has no cervical adenopathy.  Neurological: She is alert and oriented to person, place, and time.  Skin: Skin is warm and dry.     Psychiatric: She has a normal mood and affect. Her behavior is normal. Judgment and thought content normal.      Results for orders placed or performed in visit on 12/14/16  POCT urine pregnancy  Result Value Ref Range   Preg Test, Ur Negative Negative      Assessment & Plan:   Problem List Items Addressed This Visit    None    Visit Diagnoses    Establish Care w/ new Provider Pt needs to establish care w/ new PCP who is not an OBGYN provider.  [facility]  Encounter for initial prescription of contraceptive pills    -  Primary Pt desires prevention of pregnancy and wants to discuss options today.  Possible concern that pt is currently pregnant.  LMP prior to last pregnancy and only using condoms for contraception. Pt is currently lactating.  Plan: 1. Discussed OCP, patch, ring, implanon, IUD (hormonal and copper) in detail w/ common side effects of each. 2. Pt prefers to start OCP - progesterone only.  Prescription provided for norethindrone 0.35 mg tablet. 3. Educated on common side effects unique to norethindrone to include improper timing of administration and subsequent ovulation.  Use backup method for remainder of cycle if misses proper timing of dose. 4. Followup as needed and in 1 year.    Relevant Medications   norethindrone (MICRONOR,CAMILA,ERRIN) 0.35 MG tablet   Other Relevant Orders   POCT urine pregnancy (Completed)   Other eczema     Rash consistent w/ eczema.  Possible psoriasis, however, single rash present on neck at hairline.  Skin under eyes dry w/o  rash.  Plan: 1. Apply triamcinolone ointment to rash at hairline up to 2 weeks.  May use under eyes, but cautiously and limit to no more than 7 days.  Reviewed risks of use on face.  Encouraged good moisturizer for face before use of triamcinolone. 2. Follow up w/ dermatology if rash persists.  Referral placed. 3. Follow up in clinic as needed.   Relevant Orders   Ambulatory referral to Dermatology      Meds ordered this encounter  Medications  . norethindrone (MICRONOR,CAMILA,ERRIN) 0.35 MG tablet    Sig: Take 1 tablet (0.35 mg total) by mouth daily.    Dispense:  1 Package    Refill:  11    Order Specific Question:  Supervising Provider    Answer:   Smitty Cords [[id number]]  . triamcinolone (KENALOG) 0.025 % ointment    Sig: Apply 1 application topically 2 (two) times daily.    Dispense:  30 g    Refill:  0    Order Specific Question:   Supervising Provider    Answer:   Smitty Cords [[id number]]      Follow up plan: Return as needed if symptoms worsen or fail to improve and for your annual physical when that is due.  Wilhelmina Mcardle, DNP, AGPCNP-BC Adult Gerontology Primary Care Nurse Practitioner [facility] 12/14/2016, 5:35 PM

## 2016-12-14 NOTE — Progress Notes (Signed)
I have reviewed this encounter including the documentation in this note and/or discussed this patient with the provider, Wilhelmina McardleLauren Kennedy, AGPCNP-BC. I am certifying that I agree with the content of this note as supervising physician.  Saralyn PilarAlexander Karamalegos, DO [facility] 12/14/2016, 5:41 PM

## 2016-12-14 NOTE — Patient Instructions (Addendum)
Maralyn SagoSarah, Thank you for coming in to clinic today.  1. For your skin: this is likely eczema. - apply triamcinolone ointment twice daily up to 14 days to your scalp and only up to 7 days on your face.   -  Use good moisturizers.  2. For your contraception: - We are starting progesterone only pill.  It is VERY important to take within the same 30 minute time frame each day.  Set an alarm.  - Use backup if you have missed this time frame for the remainder of that cycle.   Please schedule a follow-up appointment with Wilhelmina McardleLauren Tatisha Cerino, AGNP. Return as needed if symptoms worsen or fail to improve and for your annual physical when that is due.  If you have any other questions or concerns, please feel free to call the clinic or send a message through MyChart. You may also schedule an earlier appointment if necessary.  You will receive a survey after today's visit either digitally by e-mail or paper by [employer] mail. Your experiences and feedback matter to us.  Please respond so we know how we are doing as we provide care for you.  Wilhelmina McardleLauren Judah Chevere, DNP, AGNP-BC Adult Gerontology Nurse Practitioner [facility]

## 2017-02-23 ENCOUNTER — Telehealth: Payer: Self-pay

## 2017-02-23 NOTE — Telephone Encounter (Signed)
FMLA/DISABILITY form for [employer] filled out and given to [place] for processing. 

## 2017-04-18 ENCOUNTER — Encounter: Payer: Self-pay | Admitting: Obstetrics & Gynecology

## 2017-04-19 ENCOUNTER — Other Ambulatory Visit: Payer: Self-pay | Admitting: Obstetrics & Gynecology

## 2017-04-19 NOTE — Telephone Encounter (Signed)
Please advise 

## 2017-07-12 ENCOUNTER — Other Ambulatory Visit: Payer: Self-pay | Admitting: Nurse Practitioner

## 2017-07-12 DIAGNOSIS — Z20828 Contact with and (suspected) exposure to other viral communicable diseases: Secondary | ICD-10-CM

## 2017-07-12 MED ORDER — OSELTAMIVIR PHOSPHATE 75 MG PO CAPS: 75.0000 mg | ORAL_CAPSULE | Freq: Every day | ORAL | 0 refills | Status: AC

## 2017-07-12 NOTE — Progress Notes (Signed)
The patient is basically asymptomatic and instructed to take it once daily per Lauren recommendation.

## 2017-07-12 NOTE — Progress Notes (Signed)
Pt called with exposure to 3 children who are all sick and tested positive for flu.  Requests prophylaxis.

## 2017-12-06 ENCOUNTER — Ambulatory Visit (INDEPENDENT_AMBULATORY_CARE_PROVIDER_SITE_OTHER): Payer: BC Managed Care – PPO | Admitting: Obstetrics & Gynecology

## 2017-12-06 ENCOUNTER — Other Ambulatory Visit (HOSPITAL_COMMUNITY)
Admission: RE | Admit: 2017-12-06 | Discharge: 2017-12-06 | Disposition: A | Discharge status: Home / Self Care | Payer: BC Managed Care – PPO | Source: Ambulatory Visit | Attending: Obstetrics & Gynecology | Admitting: Obstetrics & Gynecology

## 2017-12-06 ENCOUNTER — Encounter: Payer: Self-pay | Admitting: Obstetrics & Gynecology

## 2017-12-06 VITALS — BP 116/78 | HR 74 | Ht 68.0 in | Wt 137.0 lb

## 2017-12-06 DIAGNOSIS — N898 Other specified noninflammatory disorders of vagina: Secondary | ICD-10-CM

## 2017-12-06 DIAGNOSIS — Z0000 Encounter for general adult medical examination without abnormal findings: Secondary | ICD-10-CM

## 2017-12-06 DIAGNOSIS — Z124 Encounter for screening for malignant neoplasm of cervix: Secondary | ICD-10-CM

## 2017-12-06 MED ORDER — METRONIDAZOLE 0.75 % VA GEL: 1.0000 | Freq: Every day | VAGINAL | 0 refills | Status: AC

## 2017-12-06 NOTE — Patient Instructions (Signed)

## 2017-12-06 NOTE — Progress Notes (Signed)
HPI:      [name] is a 32 y.o. G1P0103 who LMP was Patient's last menstrual period was 11/15/2017., she presents today for her annual examination. The patient has no complaints today other than 1 day h/o vag d/c w itch and burn. The patient is sexually active. Her last pap: was normal. The patient does perform self breast exams.  There is no notable family history of breast or ovarian cancer in her family.  The patient has regular exercise: yes.  The patient denies current symptoms of depression.    GYN History: Contraception: condoms  PMHx: Past Medical History:  Diagnosis Date  . Medical history non-contributory    Past Surgical History:  Procedure Laterality Date  . CESAREAN SECTION  09/30/2014  . CESAREAN SECTION  08/13/2016  . CESAREAN SECTION MULTI-GESTATIONAL N/A 08/13/2016   Procedure: CESAREAN SECTION MULTI-GESTATIONAL;  Surgeon: Vena AustriaAndreas Staebler, MD;  Location: [facility] ORS;  Service: Obstetrics;  Laterality: N/A;   Family History  Problem Relation Age of Onset  . Healthy Mother   . Healthy Father   . Healthy Maternal Grandmother   . Dementia Paternal Grandfather   . Breast cancer Neg Hx   . Colon cancer Neg Hx   . Ovarian cancer Neg Hx   . Heart disease Neg Hx   . Stroke Neg Hx   . Diabetes Neg Hx   . Hypertension Neg Hx    Social History   Tobacco Use  . Smoking status: Never Smoker  . Smokeless tobacco: Never Used  Substance Use Topics  . Alcohol use: No  . Drug use: No    Current Outpatient Medications:  .  metroNIDAZOLE (METROGEL) 0.75 % vaginal gel, Place 1 Applicatorful vaginally at bedtime for 5 days., Disp: 70 g, Rfl: 0 Allergies: Patient has no known allergies.  Review of Systems  Constitutional: Negative for chills, fever and malaise/fatigue.  HENT: Negative for congestion, sinus pain and sore throat.   Eyes: Negative for blurred vision and pain.  Respiratory: Negative for cough and wheezing.   Cardiovascular: Negative for chest pain  and leg swelling.  Gastrointestinal: Negative for abdominal pain, constipation, diarrhea, heartburn, nausea and vomiting.  Genitourinary: Negative for dysuria, frequency, hematuria and urgency.  Musculoskeletal: Negative for back pain, joint pain, myalgias and neck pain.  Skin: Negative for itching and rash.  Neurological: Negative for dizziness, tremors and weakness.  Endo/Heme/Allergies: Does not bruise/bleed easily.  Psychiatric/Behavioral: Negative for depression. The patient is not nervous/anxious and does not have insomnia.     Objective: BP 116/78 (BP Location: Left Arm, Patient Position: Sitting, Cuff Size: Normal)   Pulse 74   Ht 5\' 8"  (1.727 m)   Wt 137 lb (62.1 kg)   LMP 11/15/2017   Breastfeeding? No   BMI 20.83 kg/m   Filed Weights   12/06/17 1411  Weight: 137 lb (62.1 kg)   Body mass index is 20.83 kg/m. Physical Exam  Constitutional: She is oriented to person, place, and time. She appears well-developed and well-nourished. No distress.  Genitourinary: Rectum normal, vagina normal and uterus normal. Pelvic exam was performed with patient supine. There is no rash or lesion on the right labia. There is no rash or lesion on the left labia. Vagina exhibits no lesion. No bleeding in the vagina. Right adnexum does not display mass and does not display tenderness. Left adnexum does not display mass and does not display tenderness. Cervix does not exhibit motion tenderness, lesion, friability or polyp.   Uterus  is mobile and midaxial. Uterus is not enlarged or exhibiting a mass.  HENT:  Head: Normocephalic and atraumatic. Head is without laceration.  Right Ear: Hearing normal.  Left Ear: Hearing normal.  Nose: No epistaxis.  No foreign bodies.  Mouth/Throat: Uvula is midline, oropharynx is clear and moist and mucous membranes are normal.  Eyes: Pupils are equal, round, and reactive to light.  Neck: Normal range of motion. Neck supple. No thyromegaly present.    Cardiovascular: Normal rate and regular rhythm. Exam reveals no gallop and no friction rub.  No murmur heard. Pulmonary/Chest: Effort normal and breath sounds normal. No respiratory distress. She has no wheezes. Right breast exhibits no mass, no skin change and no tenderness. Left breast exhibits no mass, no skin change and no tenderness.  Abdominal: Soft. Bowel sounds are normal. She exhibits no distension. There is no tenderness. There is no rebound.  Musculoskeletal: Normal range of motion.  Neurological: She is alert and oriented to person, place, and time. No cranial nerve deficit.  Skin: Skin is warm and dry.  Psychiatric: She has a normal mood and affect. Judgment normal.  Vitals reviewed.  Microscopic wet-mount exam shows clue cells, excessive bacteria.  Assessment:  ANNUAL EXAM 1. Annual physical exam   2. Screening for cervical cancer   3. Vaginal discharge   4.      BV  Screening Plan:            1.  Cervical Screening-  Pap smear done today  2. Breast screening- Exam annually and mammogram>40 planned   3. Colonoscopy every 10 years, Hemoccult testing - after age 79  4. Labs managed by PCP  5. Counseling for contraception: no method, counseled as to Paraguard as does not desire/side effects to hormones in past  Other:  1. Annual physical exam  2. Screening for cervical cancer - Cytology - PAP  3. Vaginal discharge - BV; Metrogel.     F/U  Return in about 1 year (around 12/07/2018) for Annual.  [name], MD, [name], [facility] 12/06/2017  2:49 PM

## 2017-12-07 LAB — CYTOLOGY - PAP
BACTERIAL VAGINITIS: NEGATIVE
Candida vaginitis: POSITIVE — AB
DIAGNOSIS: NEGATIVE
HPV: NOT DETECTED

## 2017-12-08 ENCOUNTER — Telehealth: Payer: Self-pay

## 2017-12-08 ENCOUNTER — Other Ambulatory Visit: Payer: Self-pay | Admitting: Obstetrics & Gynecology

## 2017-12-08 MED ORDER — FLUCONAZOLE 150 MG PO TABS: 150.0000 mg | ORAL_TABLET | Freq: Once | ORAL | 3 refills | Status: AC

## 2017-12-08 NOTE — Telephone Encounter (Signed)
Let her know Rx has been sent.  

## 2017-12-08 NOTE — Telephone Encounter (Signed)
Pt aware.

## 2017-12-08 NOTE — Telephone Encounter (Signed)
Pt just received Pap results that showed yeast. [facility] stated can use monistat or get a pill. Pt is going out of town & [facility] [phone]

## 2018-01-06 IMAGING — US US MFM OB DETAIL+14 WK
1 series · 16 of 28 positions shown · non-contrast
Comparison: none

PATIENT INFO:

PERFORMED BY:
SERVICE(S) PROVIDED:
INDICATIONS:
20 weeks gestation of pregnancy
Monochorionic Diamniotic pregnancy
FETAL EVALUATION (FETUS A):
Num Of Fetuses:     2
Preg. Location:     Maternal Right
Fetal Heart         141
Rate(bpm):
Cardiac Activity:   Present
Presentation:       Variable
Placenta:           Anterior, No previa
P. Cord Insertion:  Velamentous
Largest Pocket(cm)
4.5
BIOMETRY (FETUS A):
BPD:      45.7  mm     G. Age:  19w 6d         25  %    CI:            71  %    70 - 86
FL/HC:       18.2  %    16.8 -
HC:      172.8  mm     G. Age:  19w 6d         19  %
FL/BPD:      68.9  %
FL:       31.5  mm     G. Age:  19w 6d         21  %
HUM:      30.9  mm     G. Age:  20w 2d         46  %
GESTATIONAL AGE (FETUS A):
LMP:           20w 3d        Date:  01/24/16                 EDD:   10/30/16
U/S Today:     19w 6d                                        EDD:   11/03/16
Best:          20w 3d     Det. By:  LMP  (01/24/16)          EDD:   10/30/16
ANATOMY (FETUS A):
Cavum:                 CSP visualized         Aortic Arch:            Normal appearance
Ventricles:            Normal appearance      Ductal Arch:            Normal appearance
Choroid Plexus:        Within Normal Limits   Diaphragm:              Within Normal Limits
Cerebellum:            Within Normal Limits   Stomach:                Seen
Posterior Fossa:       Within Normal Limits   Abdomen:                Within Normal
Limits
Nuchal Fold:           Within Normal Limits   Abdominal Wall:         Normal appearance
Face:                  Orbits visualized      Cord Vessels:           3 vessels
Lips:                  Normal appearance      Kidneys:                Normal appearance
Thoracic:              Within Normal Limits   Bladder:                Seen
Heart:                 4-Chamber view         Spine:                  Normal appearance
appears normal
RVOT:                  Normal appearance      Upper Extremities:      Visualized
LVOT:                  Normal appearance      Lower Extremities:      Visualized
Other:  3 vessel trachea inadequately visualized
FETAL EVALUATION (FETUS B):
Preg. Location:     Maternal Left
Fetal Heart         145
Presentation:       Breech
3.8
BIOMETRY (FETUS B):
BPD:      46.5  mm     G. Age:  20w 0d         34  %    CI:        72.65   %    70 - 86
FL/HC:       18.1  %    16.8 -
HC:      173.5  mm     G. Age:  19w 6d         20  %
FL/BPD:      67.5  %
FL:       31.4  mm     G. Age:  19w 5d         21  %
HUM:      31.1  mm     G. Age:  20w 2d         48  %
GESTATIONAL AGE (FETUS B):
ANATOMY (FETUS B):
CERVIX UTERUS ADNEXA:
Cervix
Length:            3.9  cm.

[Series 1: us mfm ob detail+14 wk · 0.23mm/px · 16 of 168 slices shown]
[im 1/168]
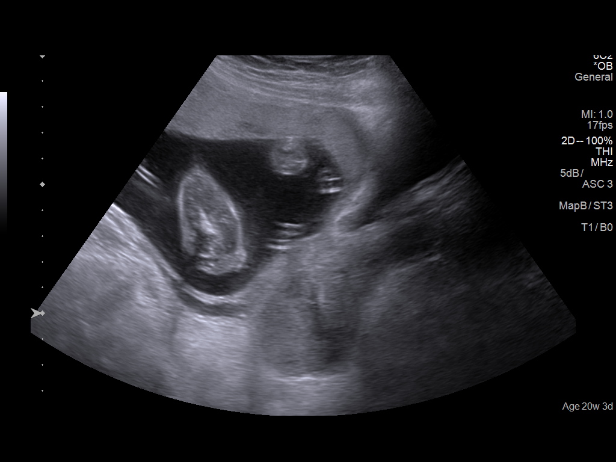
[im 13/168]
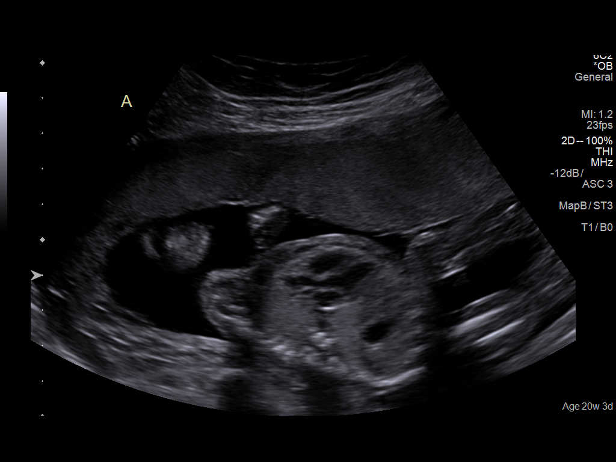
[im 25/168]
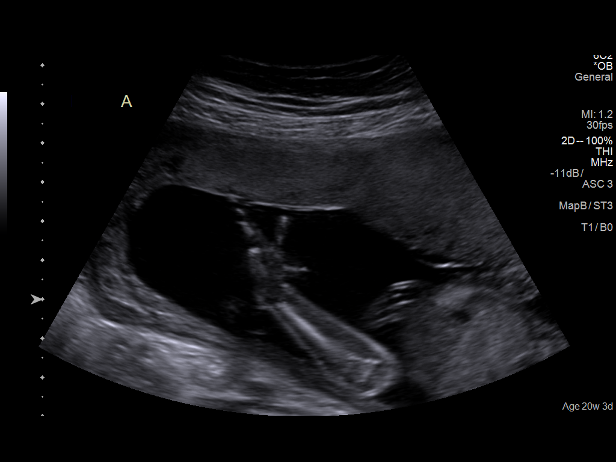
[im 38/168]
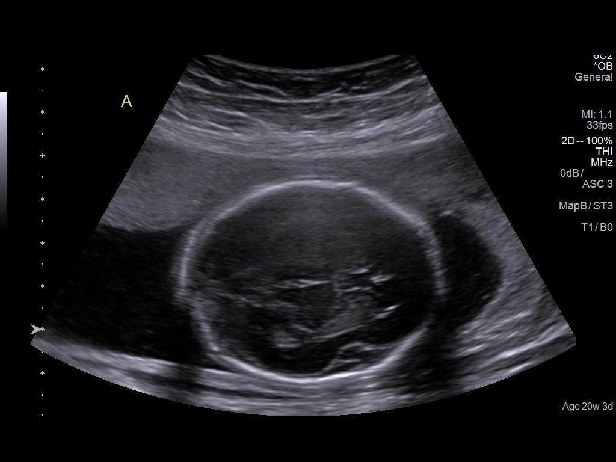
[im 44/168]
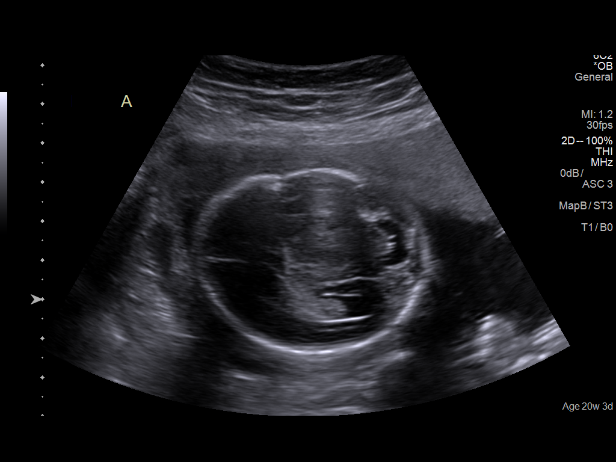
[im 56/168]
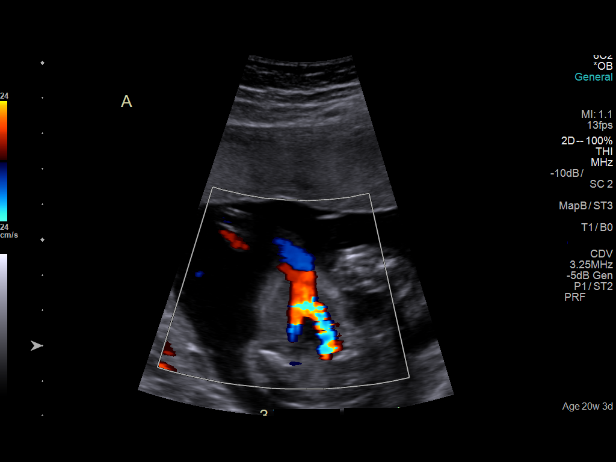
[im 69/168]
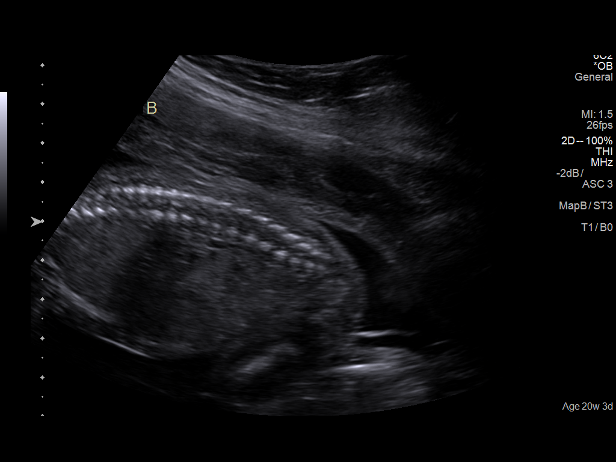
[im 81/168]
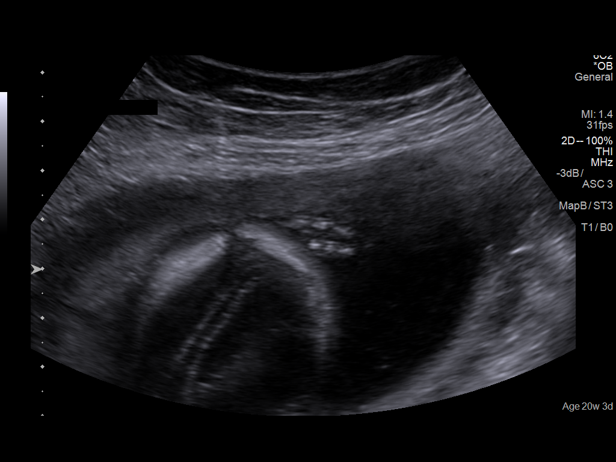
[im 87/168]
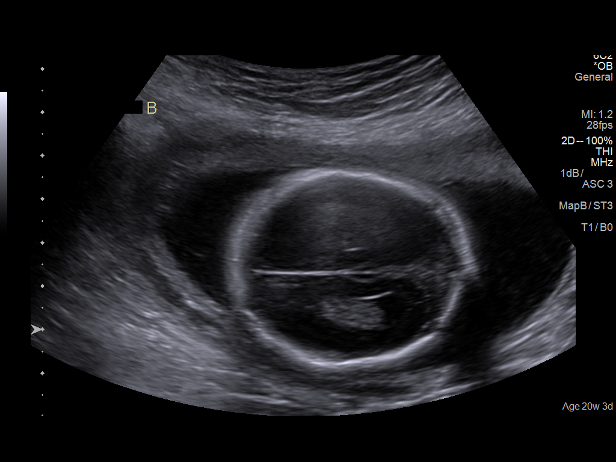
[im 99/168]
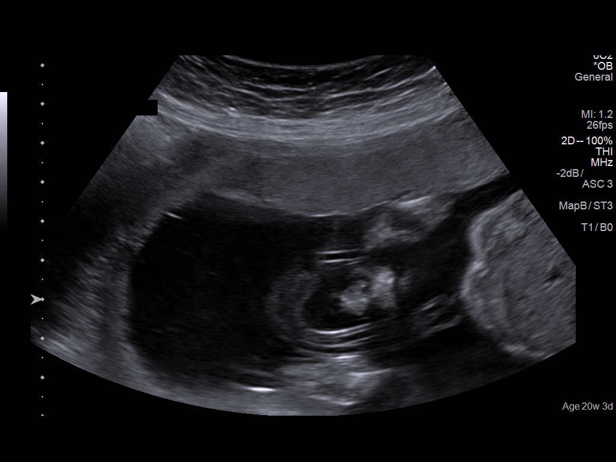
[im 112/168]
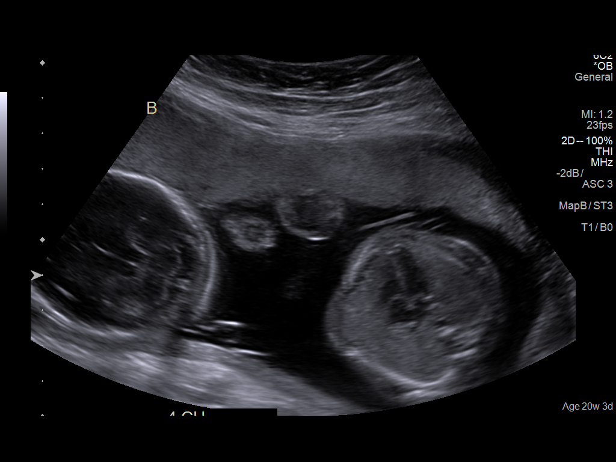
[im 124/168]
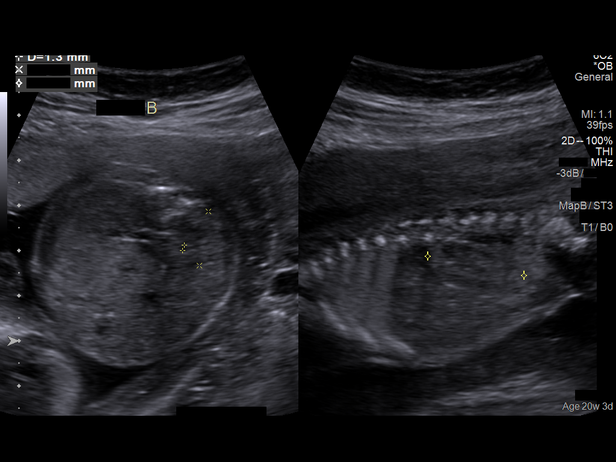
[im 130/168]
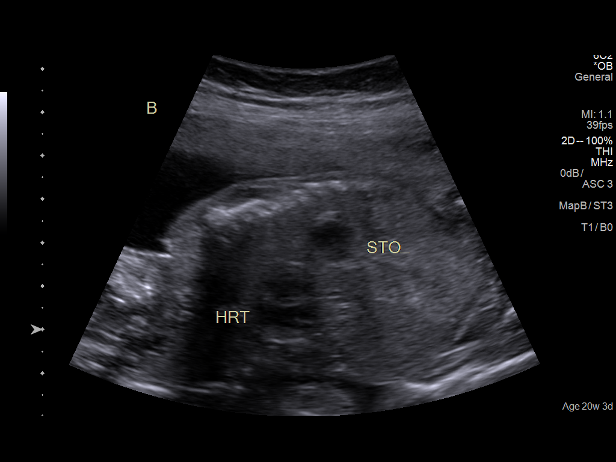
[im 143/168]
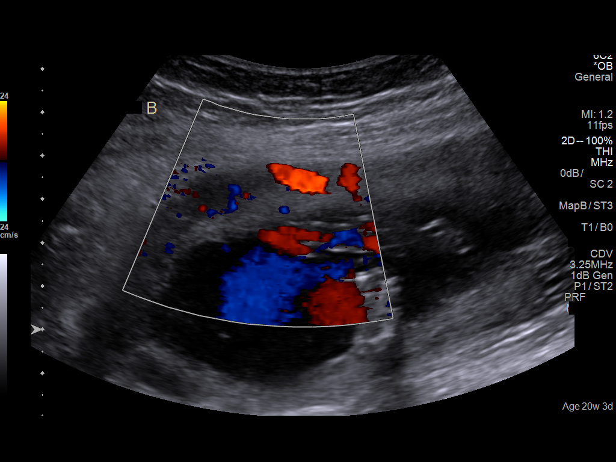
[im 155/168]
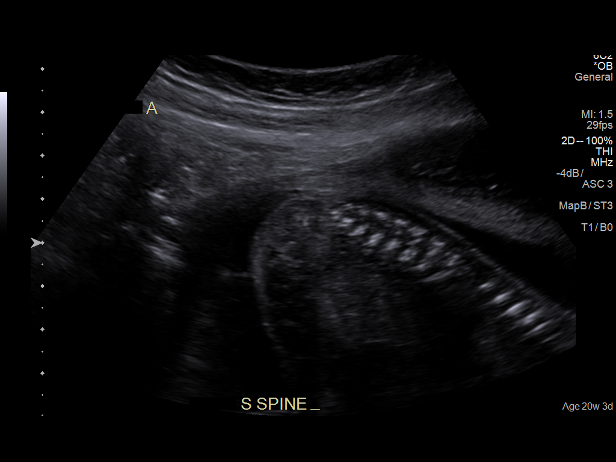
[im 168/168]
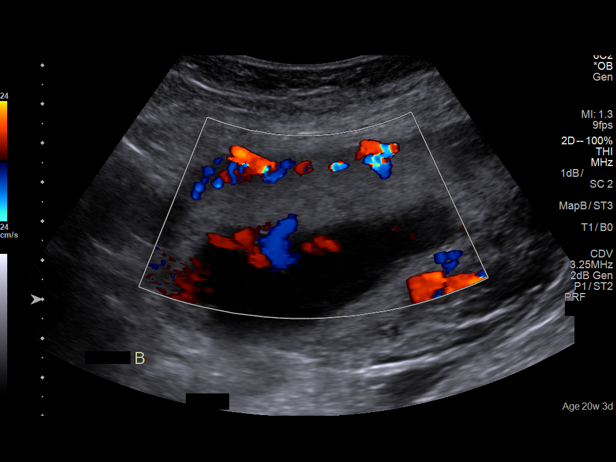

[16 of 28 positions shown; findings below may reference images not displayed]

IMPRESSION: Intrauterine twin gestation with a best estimated gestational
age of 20 weeks 3 days.  Dating is based on LMP consistent
with earliest available ultrasound performed at [facility] on 03/17/2016; measurements were reported as 7
weeks 1 day.

Detailed ultrasound performed for the indication of multiple
gestation.

Presence of a thin dividing membrane, shared placentas and
same fetal genitalia indicate monochorionic diamniotic
placentation.

Twin A:  Variable presentation, anterior placenta, maternal
RIGHT, appears female.  Due to fetal position, the 3 vessel
trachea view was inadequately visualized.  Other fetal
anatomy visualized appears normal.  There is a
VELAMENTOUS CORD insert.  No fetal vessels are seen
overlying the cervix.

Twin B:  Breech presentation, anterior placenta, maternal
LEFT, appears female.  Due to fetal position, the 3 vessel
trachea view was inadequately visualized.  Other fetal
anatomy visualized appears normal.

No evidence of TTTS of fetal hydrops.

Findings were discussed.  Monochorionic placentation is
associated with more than a 10% risk for development of twin
to twin transfusion syndrome (TTTS).  We reviewed the
potential for significant morbidity and the risk for fetal
mortality associated with TTTS.  Recommend screening
every 2 weeks for signs of TTTS, with assessment of growth
every 4 weeks (every 3 weeks of growth restriction develops).
Consider antenatal testing to start at 32-34 weeks unless
indicated earlier, at least weekly (or twice weekly should fetal
growth restriction develop).  Delivery is recommended around
36-37 weeks, assuming no other complications.  We also
discussed the recommendation for screening fetal
echocardiograms (ordered).

We discussed that should signs of TTTS develop,
management strategies included more frequent surveillance
and possible referral to a fetal [HOSPITAL] for
consideration of laser therapy.

Other risks of twin gestation in general were also reviewed,
including preterm labor, preterm delivery, need for
hospitalization, development of gestational diabetes,
preeclampsia and fetal growth abnormalities.

We would be more than happy to participate in twin growth
ultrasounds or screening for TTTS in any way desired.
Surveillance for Twin A's velamentous cord insert will be
dictated by the management plan of monochorionic  twins.

## 2018-02-24 IMAGING — US US MFM OB LIMITED
1 series · 13 of 19 positions shown · non-contrast
Comparison: none

PATIENT INFO:

PERFORMED BY:
SERVICE(S) PROVIDED:
US MFM OB LIMITED                                     76815.01
INDICATIONS:
27 weeks gestation of pregnancy
Monochorionic Diamniotic twins (TTTS
check)
FETAL EVALUATION (FETUS A):
Num Of Fetuses:     2
Preg. Location:     Right uterus
Fetal Heart         147
Rate(bpm):
Cardiac Activity:   Present
Presentation:       Vertex
Placenta:           Anterior
Largest Pocket(cm)
6.2
GESTATIONAL AGE (FETUS A):
LMP:           27w 3d        Date:  01/24/16                 EDD:   10/30/16
Best:          27w 3d     Det. By:  LMP  (01/24/16)          EDD:   10/30/16
ANATOMY (FETUS A):
Stomach:               Within Normal Limits   Bladder:                Within Normal Limits
FETAL EVALUATION (FETUS B):
Preg. Location:     Left uterus
Fetal Heart         149
4.9
GESTATIONAL AGE (FETUS B):
ANATOMY (FETUS B):

[Series 1: us mfm ob limited · 0.26mm/px · 19 acquisitions, 13 frames shown]
[im 1/19]
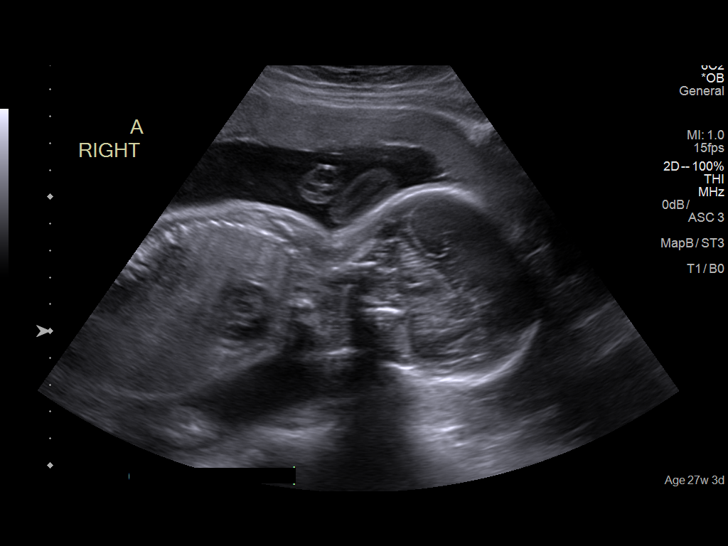
[im 3/19]
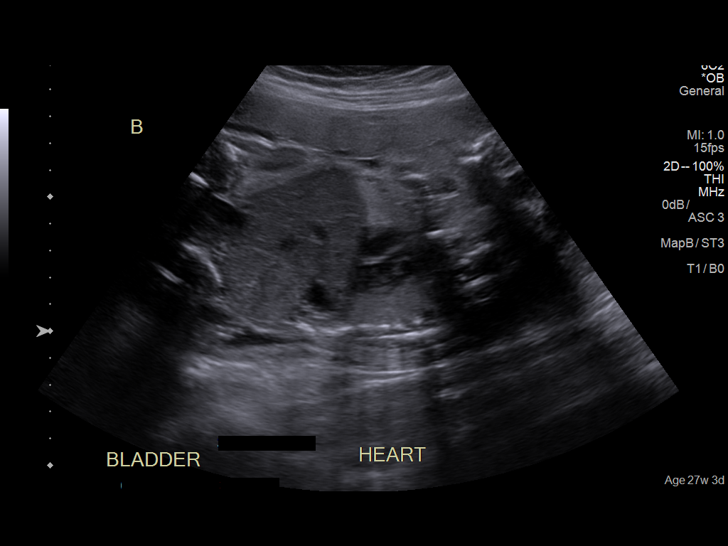
[im 4/19]
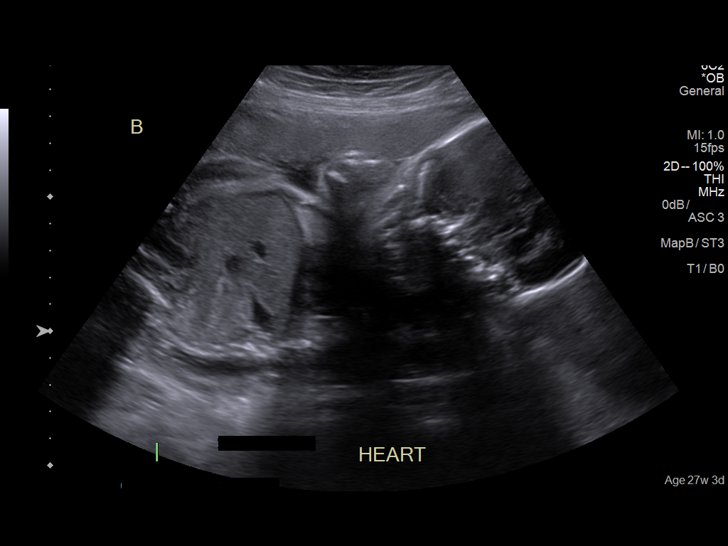
[im 6/19]
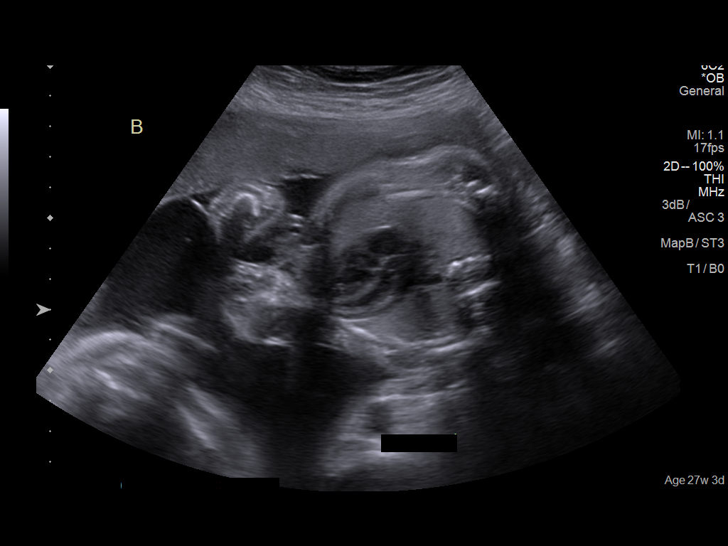
[im 7/19]
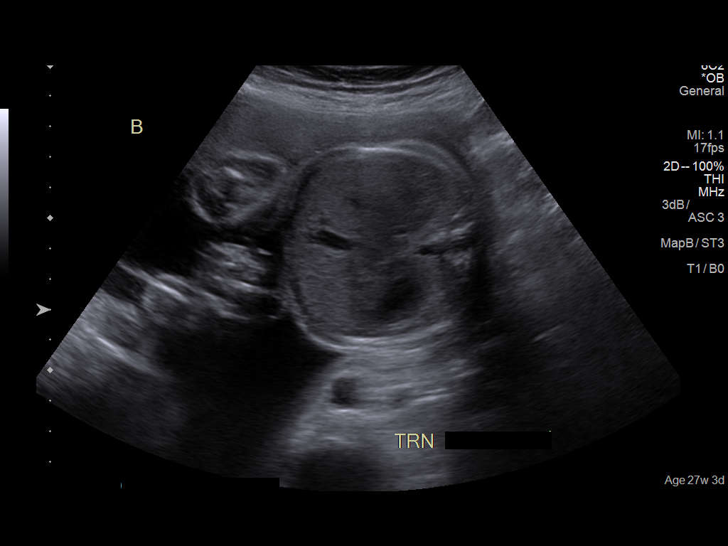
[im 9/19]
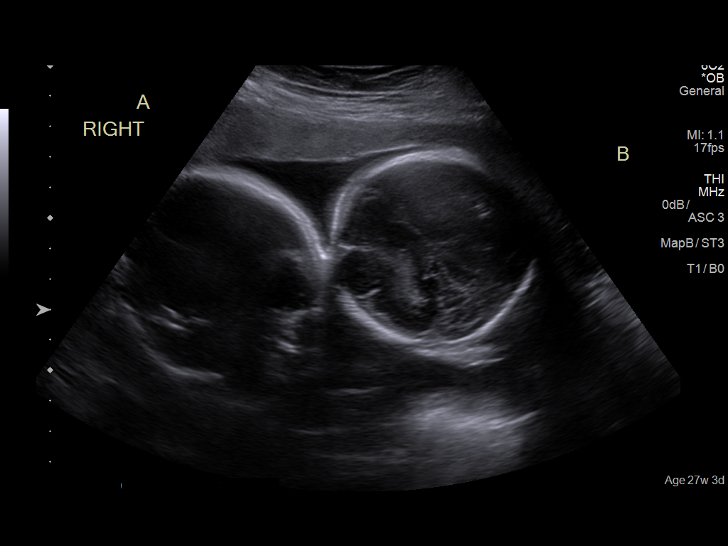
[im 10/19]
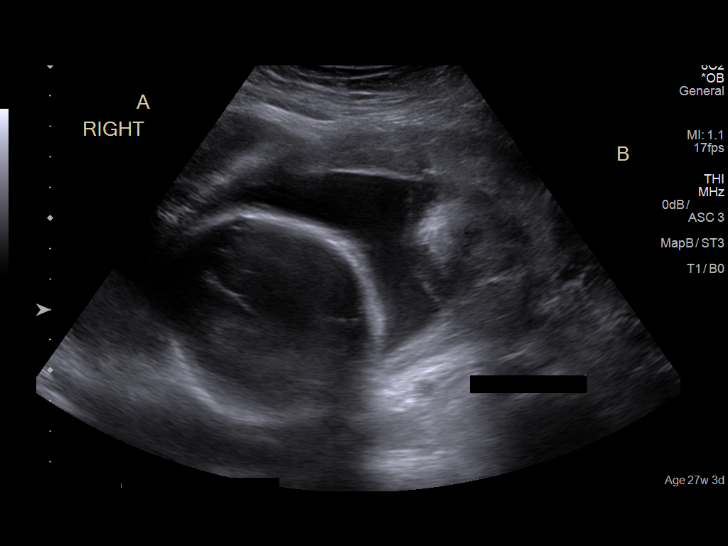
[im 11/19]
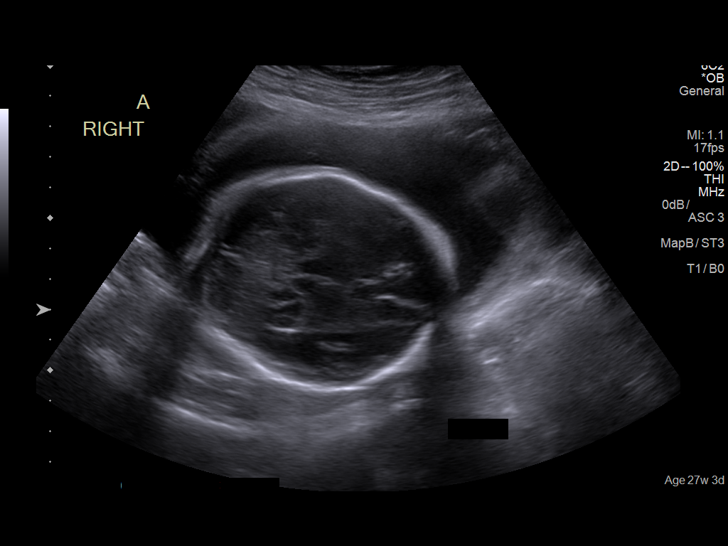
[im 13/19]
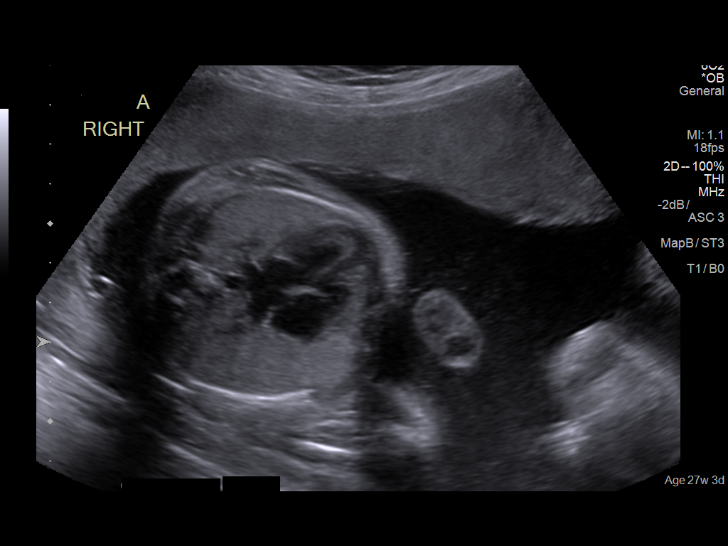
[im 14/19]
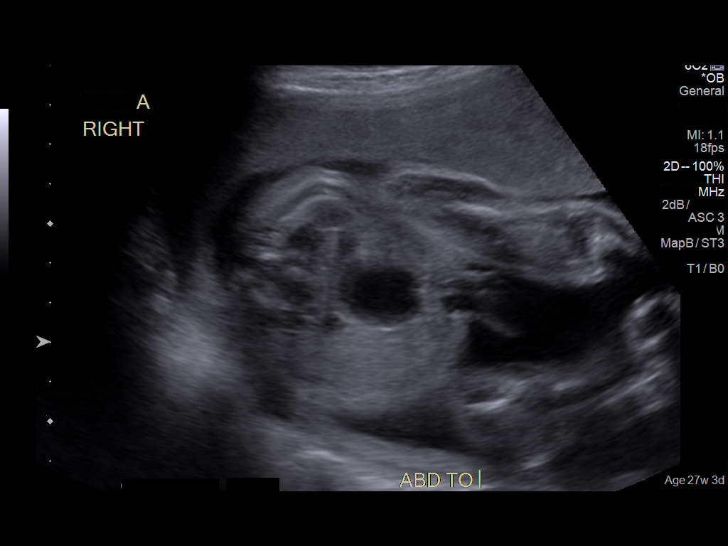
[im 16/19]
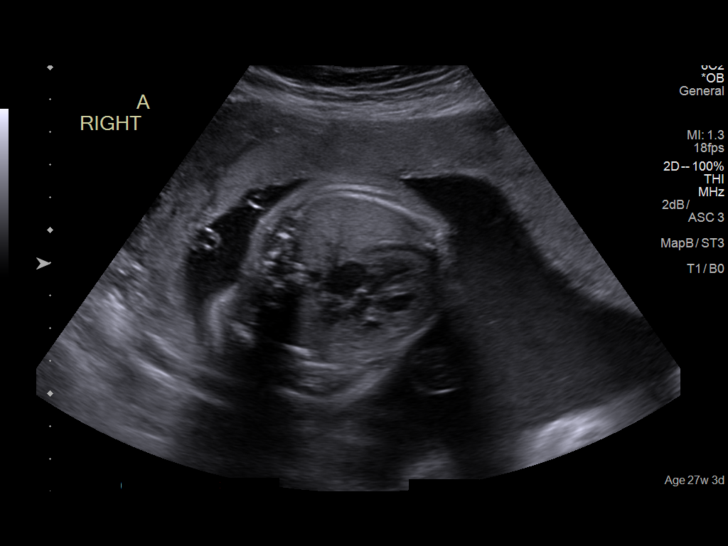
[im 17/19]
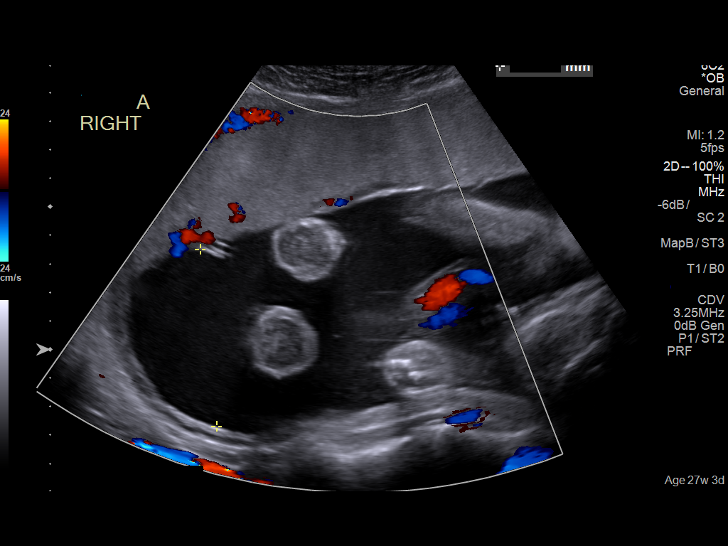
[im 19/19]
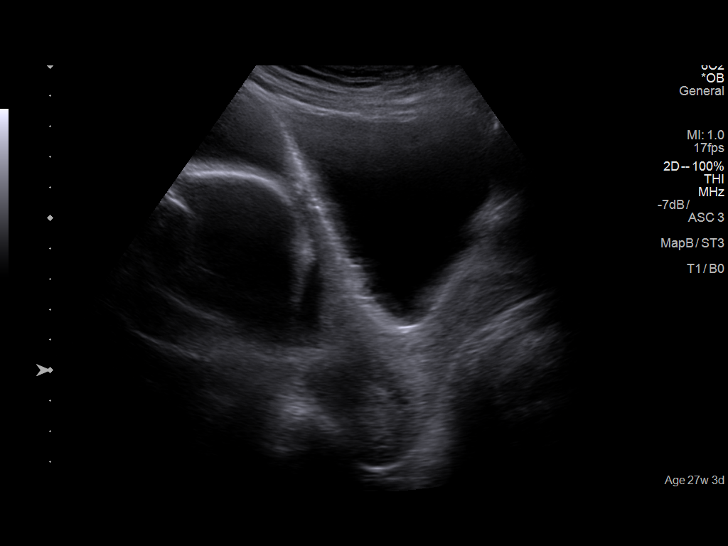

[13 of 19 positions shown; findings below may reference images not displayed]

IMPRESSION: Intrauterine twin gestation with a best estimated gestational
age of 80weeks 3 days.  Dating is based on LMP consistent
with earliest available ultrasound performed at [facility] on 03/17/2016; measurements were reported as 7
weeks 1 day.

Exam limited to evaluation for TTTS/hydrops check.

Twin A:  right uterus, cephalic, anterior placenta.  MVP
cm.  No evidence of TTTS.
Twin B:  left uterus, cephalic, anterior placenta. MVP 4.9 cm.
No evidence of TTTS.

Follow up ultrasound for twin growth and TTTS check already
scheduled in 2 weeks.

Fetal echocardiograms did not get scheduled - will schedule
today.

## 2018-03-02 ENCOUNTER — Ambulatory Visit (INDEPENDENT_AMBULATORY_CARE_PROVIDER_SITE_OTHER): Payer: BC Managed Care – PPO | Admitting: Obstetrics and Gynecology

## 2018-03-02 ENCOUNTER — Encounter: Payer: Self-pay | Admitting: Obstetrics and Gynecology

## 2018-03-02 VITALS — BP 100/60 | HR 64 | Ht 68.0 in | Wt 132.0 lb

## 2018-03-02 DIAGNOSIS — B3731 Acute candidiasis of vulva and vagina: Secondary | ICD-10-CM

## 2018-03-02 DIAGNOSIS — R300 Dysuria: Secondary | ICD-10-CM | POA: Diagnosis not present

## 2018-03-02 DIAGNOSIS — B373 Candidiasis of vulva and vagina: Secondary | ICD-10-CM | POA: Diagnosis not present

## 2018-03-02 MED ORDER — TERCONAZOLE 0.8 % VA CREA: 1.0000 | TOPICAL_CREAM | Freq: Every day | VAGINAL | 0 refills | Status: AC

## 2018-03-02 NOTE — Progress Notes (Signed)
Galen Manila, NP   Chief Complaint  Patient presents with  . Vaginitis    discharge, odor, itchiness and also having burning when urinating and frequency x 1 week    HPI:      [name] is a 32 y.o. [id number] who LMP was Patient's last menstrual period was 02/14/2018 (exact date)., presents today for increased vag d/c with irritation, no odor for the past wk. Treated with monistat-3 without sx relief. Has had dysuria and urinary urgency for a few days and mild pelvic discomfort for 1 day, no LBP, fevers, hematuria. Yeast on 7/19 pap and treated with diflucan with sx relief.  No new partners. No recent abx use.   Past Medical History:  Diagnosis Date  . Medical history non-contributory     Past Surgical History:  Procedure Laterality Date  . CESAREAN SECTION  09/30/2014  . CESAREAN SECTION  08/13/2016  . CESAREAN SECTION MULTI-GESTATIONAL N/A 08/13/2016   Procedure: CESAREAN SECTION MULTI-GESTATIONAL;  Surgeon: Vena Austria, MD;  Location: [facility] ORS;  Service: Obstetrics;  Laterality: N/A;    Family History  Problem Relation Age of Onset  . Healthy Mother   . Healthy Father   . Healthy Maternal Grandmother   . Dementia Paternal Grandfather   . Breast cancer Neg Hx   . Colon cancer Neg Hx   . Ovarian cancer Neg Hx   . Heart disease Neg Hx   . Stroke Neg Hx   . Diabetes Neg Hx   . Hypertension Neg Hx     Social History   Socioeconomic History  . Marital status: Married    Spouse name: Not on file  . Number of children: Not on file  . Years of education: Not on file  . Highest education level: Not on file  Occupational History  . Not on file  Social Needs  . Financial resource strain: Not on file  . Food insecurity:    Worry: Not on file    Inability: Not on file  . Transportation needs:    Medical: Not on file    Non-medical: Not on file  Tobacco Use  . Smoking status: Never Smoker  . Smokeless tobacco: Never Used  Substance and  Sexual Activity  . Alcohol use: No  . Drug use: No  . Sexual activity: Yes    Birth control/protection: Condom  Lifestyle  . Physical activity:    Days per week: Not on file    Minutes per session: Not on file  . Stress: Not on file  Relationships  . Social connections:    Talks on phone: Not on file    Gets together: Not on file    Attends religious service: Not on file    Active member of club or organization: Not on file    Attends meetings of clubs or organizations: Not on file    Relationship status: Not on file  . Intimate partner violence:    Fear of current or ex partner: Not on file    Emotionally abused: Not on file    Physically abused: Not on file    Forced sexual activity: Not on file  Other Topics Concern  . Not on file  Social History Narrative  . Not on file    No outpatient medications prior to visit.   No facility-administered medications prior to visit.       ROS:  Review of Systems  Constitutional: Negative for fever.  Gastrointestinal: Negative for  blood in stool, constipation, diarrhea, nausea and vomiting.  Genitourinary: Positive for urgency and vaginal discharge. Negative for dyspareunia, dysuria, flank pain, frequency, hematuria, pelvic pain, vaginal bleeding and vaginal pain.  Musculoskeletal: Negative for back pain.  Skin: Negative for rash.   OBJECTIVE:   Vitals:  BP 100/60   Pulse 64   Ht 5\' 8"  (1.727 m)   Wt 132 lb (59.9 kg)   LMP 02/14/2018 (Exact Date)   Breastfeeding? No   BMI 20.07 kg/m   Physical Exam  Constitutional: She is oriented to person, place, and time. Vital signs are normal. She appears well-developed.  Pulmonary/Chest: Effort normal.  Genitourinary: Uterus normal.    There is no rash, tenderness or lesion on the right labia. There is no rash, tenderness or lesion on the left labia. Uterus is not enlarged and not tender. Cervix exhibits no motion tenderness. Right adnexum displays no mass and no tenderness.  Left adnexum displays no mass and no tenderness. No erythema or tenderness in the vagina. Vaginal discharge found.  Musculoskeletal: Normal range of motion.  Neurological: She is alert and oriented to person, place, and time.  Psychiatric: She has a normal mood and affect. Her behavior is normal. Thought content normal.  Vitals reviewed.   Results: Results for orders placed or performed in visit on 03/02/18 (from the past 24 hour(s))  POCT Wet Prep with KOH     Status: Abnormal   Collection Time: 03/03/18  8:13 AM  Result Value Ref Range   Trichomonas, UA Negative    Clue Cells Wet Prep HPF POC neg    Epithelial Wet Prep HPF POC     Yeast Wet Prep HPF POC pos    Bacteria Wet Prep HPF POC     RBC Wet Prep HPF POC     WBC Wet Prep HPF POC     KOH Prep POC Negative Negative  POCT Urinalysis Dipstick     Status: Normal   Collection Time: 03/03/18  8:14 AM  Result Value Ref Range   Color, UA yellow    Clarity, UA clear    Glucose, UA Negative Negative   Bilirubin, UA neg    Ketones, UA neg    Spec Grav, UA 1.015 1.010 - 1.025   Blood, UA neg    pH, UA 5.0 5.0 - 8.0   Protein, UA Negative Negative   Urobilinogen, UA     Nitrite, UA neg    Leukocytes, UA Negative Negative   Appearance     Odor       Assessment/Plan: Yeast vaginitis - Pos sx/wet prep. Rx terazol since already tx with diflucan and monistat. F/u prn.  - Plan: POCT Wet Prep with KOH, terconazole (TERAZOL 3) 0.8 % vaginal cream  Dysuria - Neg UA. Most likely due to yeast vag. F/u prn.  - Plan: POCT Urinalysis Dipstick    Meds ordered this encounter  Medications  . terconazole (TERAZOL 3) 0.8 % vaginal cream    Sig: Place 1 applicator vaginally at bedtime for 3 days.    Dispense:  20 g    Refill:  0    Order Specific Question:   Supervising Provider    Answer:   Nadara Mustard [[id number]]      Return if symptoms worsen or fail to improve.  Doris Gruhn B. Marquetta Weiskopf, PA-C 03/03/2018 8:16 AM

## 2018-03-03 ENCOUNTER — Encounter: Payer: Self-pay | Admitting: Obstetrics and Gynecology

## 2018-03-03 LAB — POCT WET PREP WITH KOH
CLUE CELLS WET PREP PER HPF POC: NEGATIVE
KOH PREP POC: NEGATIVE
TRICHOMONAS UA: NEGATIVE
YEAST WET PREP PER HPF POC: POSITIVE

## 2018-03-03 LAB — POCT URINALYSIS DIPSTICK
Bilirubin, UA: NEGATIVE
GLUCOSE UA: NEGATIVE
Ketones, UA: NEGATIVE
LEUKOCYTES UA: NEGATIVE
NITRITE UA: NEGATIVE
PROTEIN UA: NEGATIVE
RBC UA: NEGATIVE
Spec Grav, UA: 1.015 (ref 1.010–1.025)
pH, UA: 5 (ref 5.0–8.0)

## 2018-03-03 NOTE — Patient Instructions (Signed)
I value your feedback and entrusting us with your care. If you get a [facility] patient survey, I would appreciate you taking the time to let us know about your experience today. Thank you! 

## 2018-03-09 ENCOUNTER — Other Ambulatory Visit: Payer: Self-pay | Admitting: Obstetrics & Gynecology

## 2018-03-09 MED ORDER — FLUCONAZOLE 150 MG PO TABS: 150.0000 mg | ORAL_TABLET | Freq: Once | ORAL | 3 refills | Status: AC

## 2018-05-26 ENCOUNTER — Telehealth: Payer: Self-pay | Admitting: Obstetrics & Gynecology

## 2018-05-26 NOTE — Telephone Encounter (Signed)
error 

## 2018-05-26 NOTE — Telephone Encounter (Signed)
Paragard reserved for this patient. 

## 2018-05-26 NOTE — Telephone Encounter (Signed)
Patient is schedule 11:30 05/27/17

## 2018-05-26 NOTE — Telephone Encounter (Signed)
-----   Message from Nadara Mustard, MD sent at 05/26/2018  8:05 AM EST ----- Regarding: APPT FRIDAY Please schedule Chloe Kelley for appt tomorrow Friday Jan 3 for IUD, can be anytime in am Please text me time for her appt Crystal, please make sure we have a PARAGUARD  Thx all PH

## 2018-05-27 ENCOUNTER — Ambulatory Visit (INDEPENDENT_AMBULATORY_CARE_PROVIDER_SITE_OTHER): Payer: BC Managed Care – PPO | Admitting: Obstetrics & Gynecology

## 2018-05-27 ENCOUNTER — Encounter: Payer: Self-pay | Admitting: Obstetrics & Gynecology

## 2018-05-27 VITALS — BP 100/60 | Ht 68.0 in | Wt 127.0 lb

## 2018-05-27 DIAGNOSIS — Z975 Presence of (intrauterine) contraceptive device: Secondary | ICD-10-CM

## 2018-05-27 DIAGNOSIS — Z30430 Encounter for insertion of intrauterine contraceptive device: Secondary | ICD-10-CM

## 2018-05-27 NOTE — Progress Notes (Signed)
  IUD PROCEDURE NOTE:  Chloe Kelley is a 33 y.o. [phone] here for IUD insertion. No GYN concerns.  Last pap smear was normal.  Desires Mirena over Paraguard at this time.  Periods monthly, 4 days, mod cramping.  IUD Insertion Procedure Note Patient identified, informed consent performed, consent signed.   Discussed risks of irregular bleeding, cramping, infection, malpositioning or misplacement of the IUD outside the uterus which may require further procedure such as laparoscopy, risk of failure <1%. Time out was performed.  Urine pregnancy test negative.  A bimanual exam showed the uterus to be midposition.  Speculum placed in the vagina.  Cervix visualized.  Cleaned with Betadine x 2.  Grasped anteriorly with a single tooth tenaculum.  Uterus sounded to 7 cm.   IUD placed per manufacturer's recommendations.  Strings trimmed to 3 cm. Tenaculum was removed, good hemostasis noted.  Patient tolerated procedure well.   Patient was given post-procedure instructions.  She was advised to have backup contraception for one week.  Patient was also asked to check IUD strings periodically and follow up in 4 weeks for IUD check.  [name], MD, [name], [facility] 05/27/2018  11:54 AM

## 2018-05-27 NOTE — Patient Instructions (Signed)
Intrauterine Device Insertion, Care After    This sheet gives you information about how to care for yourself after your procedure. Your health care provider may also give you more specific instructions. If you have problems or questions, contact your health care provider.  What can I expect after the procedure?  After the procedure, it is common to have:  · Cramps and pain in the abdomen.  · Light bleeding (spotting) or heavier bleeding that is like your menstrual period. This may last for up to a few days.  · Lower back pain.  · Dizziness.  · Headaches.  · Nausea.  Follow these instructions at home:  · Before resuming sexual activity, check to make sure that you can feel the IUD string(s). You should be able to feel the end of the string(s) below the opening of your cervix. If your IUD string is in place, you may resume sexual activity.  ? If you had a hormonal IUD inserted more than 7 days after your most recent period started, you will need to use a backup method of birth control for 7 days after IUD insertion. Ask your health care provider whether this applies to you.  · Continue to check that the IUD is still in place by feeling for the string(s) after every menstrual period, or once a month.  · Take over-the-counter and prescription medicines only as told by your health care provider.  · Do not drive or use heavy machinery while taking prescription pain medicine.  · Keep all follow-up visits as told by your health care provider. This is important.  Contact a health care provider if:  · You have bleeding that is heavier or lasts longer than a normal menstrual cycle.  · You have a fever.  · You have cramps or abdominal pain that get worse or do not get better with medicine.  · You develop abdominal pain that is new or is not in the same area of earlier cramping and pain.  · You feel lightheaded or weak.  · You have abnormal or bad-smelling discharge from your vagina.  · You have pain during sexual  activity.  · You have any of the following problems with your IUD string(s):  ? The string bothers or hurts you or your sexual partner.  ? You cannot feel the string.  ? The string has gotten longer.  · You can feel the IUD in your vagina.  · You think you may be pregnant, or you miss your menstrual period.  · You think you may have an STI (sexually transmitted infection).  Get help right away if:  · You have flu-like symptoms.  · You have a fever and chills.  · You can feel that your IUD has slipped out of place.  Summary  · After the procedure, it is common to have cramps and pain in the abdomen. It is also common to have light bleeding (spotting) or heavier bleeding that is like your menstrual period.  · Continue to check that the IUD is still in place by feeling for the string(s) after every menstrual period, or once a month.  · Keep all follow-up visits as told by your health care provider. This is important.  · Contact your health care provider if you have problems with your IUD string(s), such as the string getting longer or bothering you or your sexual partner.  This information is not intended to replace advice given to you by your health care provider. Make   sure you discuss any questions you have with your health care provider.  Document Released: 01/07/2011 Document Revised: 04/01/2016 Document Reviewed: 04/01/2016  Elsevier Interactive Patient Education © 2019 [employer]

## 2018-06-24 ENCOUNTER — Ambulatory Visit (INDEPENDENT_AMBULATORY_CARE_PROVIDER_SITE_OTHER): Payer: BC Managed Care – PPO | Admitting: Obstetrics & Gynecology

## 2018-06-24 ENCOUNTER — Encounter: Payer: Self-pay | Admitting: Obstetrics & Gynecology

## 2018-06-24 VITALS — BP 100/60 | Ht 68.0 in | Wt 130.0 lb

## 2018-06-24 DIAGNOSIS — Z30431 Encounter for routine checking of intrauterine contraceptive device: Secondary | ICD-10-CM | POA: Diagnosis not present

## 2018-06-24 DIAGNOSIS — Z975 Presence of (intrauterine) contraceptive device: Secondary | ICD-10-CM

## 2018-06-24 NOTE — Progress Notes (Signed)
  History of Present Illness:  Chloe Kelley is a 33 y.o. that had a Mirena IUD placed approximately 6 weeks ago. Since that time, she states that she has had no bleeding and pain  PMHx: She  has a past medical history of Medical history non-contributory. Also,  has a past surgical history that includes Cesarean section multi-gestational (N/A, 08/13/2016); Cesarean section (09/30/2014); and Cesarean section (08/13/2016)., family history includes Dementia in her paternal grandfather; Healthy in her father, maternal grandmother, and mother.,  reports that she has never smoked. She has never used smokeless tobacco. She reports that she does not drink alcohol or use drugs. Current Meds  Medication Sig  . levonorgestrel (MIRENA) 20 MCG/24HR IUD 1 each by Intrauterine route once.  .  Also, has No Known Allergies..  Review of Systems  All other systems reviewed and are negative.  Physical Exam:  BP 100/60   Ht 5\' 8"  (1.727 m)   Wt 130 lb (59 kg)   LMP 05/30/2018   BMI 19.77 kg/m  Body mass index is 19.77 kg/m. Constitutional: Well nourished, well developed female in no acute distress.  Abdomen: diffusely non tender to palpation, non distended, and no masses, hernias Neuro: Grossly intact Psych:  Normal mood and affect.    Pelvic exam:  Two IUD strings present seen coming from the cervical os. EGBUS, vaginal vault and cervix: within normal limits  Assessment: IUD strings present in proper location; pt doing well  Plan: She was told to continue to use barrier contraception, in order to prevent any STIs, and to take a home pregnancy test or call us if she ever thinks she may be pregnant, and that her IUD expires in 5 years.  She was amenable to this plan and we will see her back in 1 year/PRN.  A total of 15 minutes were spent face-to-face with the patient during this encounter and over half of that time dealt with counseling and coordination of care.  [name], MD, [name]  Ob/Gyn, [facility] 06/24/2018  4:20 PM

## 2018-12-23 ENCOUNTER — Encounter: Payer: Self-pay | Admitting: Obstetrics & Gynecology

## 2018-12-23 ENCOUNTER — Ambulatory Visit (INDEPENDENT_AMBULATORY_CARE_PROVIDER_SITE_OTHER): Payer: BC Managed Care – PPO | Admitting: Obstetrics & Gynecology

## 2018-12-23 ENCOUNTER — Other Ambulatory Visit: Payer: Self-pay

## 2018-12-23 VITALS — BP 110/72 | HR 77 | Ht 67.5 in | Wt 135.0 lb

## 2018-12-23 DIAGNOSIS — Z13220 Encounter for screening for lipoid disorders: Secondary | ICD-10-CM

## 2018-12-23 DIAGNOSIS — Z01419 Encounter for gynecological examination (general) (routine) without abnormal findings: Secondary | ICD-10-CM | POA: Diagnosis not present

## 2018-12-23 DIAGNOSIS — Z1321 Encounter for screening for nutritional disorder: Secondary | ICD-10-CM

## 2018-12-23 DIAGNOSIS — Z1329 Encounter for screening for other suspected endocrine disorder: Secondary | ICD-10-CM

## 2018-12-23 DIAGNOSIS — Z131 Encounter for screening for diabetes mellitus: Secondary | ICD-10-CM

## 2018-12-23 DIAGNOSIS — Z30431 Encounter for routine checking of intrauterine contraceptive device: Secondary | ICD-10-CM

## 2018-12-23 NOTE — Progress Notes (Signed)
HPI:      [name] is a 33 y.o. [phone] who LMP was No LMP recorded. (Menstrual status: IUD)., she presents today for her annual examination. The patient has no complaints today. The patient is sexually active. Her last pap: approximate date 2019 and was normal. No h/o abn PAP. The patient does perform self breast exams.  There is no notable family history of breast or ovarian cancer in her family.  The patient has regular exercise: yes.  The patient denies current symptoms of depression.    GYN History: Contraception: IUD since 05/2018; min bleeding and no neg SE  PMHx: Past Medical History:  Diagnosis Date  . Medical history non-contributory    Past Surgical History:  Procedure Laterality Date  . CESAREAN SECTION  09/30/2014  . CESAREAN SECTION  08/13/2016  . CESAREAN SECTION MULTI-GESTATIONAL N/A 08/13/2016   Procedure: CESAREAN SECTION MULTI-GESTATIONAL;  Surgeon: Vena AustriaAndreas Staebler, MD;  Location: [facility] ORS;  Service: Obstetrics;  Laterality: N/A;   Family History  Problem Relation Age of Onset  . Healthy Mother   . Healthy Father   . Healthy Maternal Grandmother   . Dementia Paternal Grandfather   . Breast cancer Neg Hx   . Colon cancer Neg Hx   . Ovarian cancer Neg Hx   . Heart disease Neg Hx   . Stroke Neg Hx   . Diabetes Neg Hx   . Hypertension Neg Hx    Social History   Tobacco Use  . Smoking status: Never Smoker  . Smokeless tobacco: Never Used  Substance Use Topics  . Alcohol use: No  . Drug use: No    Current Outpatient Medications:  .  levonorgestrel (MIRENA) 20 MCG/24HR IUD, 1 each by Intrauterine route once., Disp: , Rfl:  Allergies: Patient has no known allergies.  Review of Systems  Constitutional: Negative for chills, fever and malaise/fatigue.  HENT: Negative for congestion, sinus pain and sore throat.   Eyes: Negative for blurred vision and pain.  Respiratory: Negative for cough and wheezing.   Cardiovascular: Negative for chest pain  and leg swelling.  Gastrointestinal: Negative for abdominal pain, constipation, diarrhea, heartburn, nausea and vomiting.  Genitourinary: Negative for dysuria, frequency, hematuria and urgency.  Musculoskeletal: Negative for back pain, joint pain, myalgias and neck pain.  Skin: Negative for itching and rash.  Neurological: Negative for dizziness, tremors and weakness.  Endo/Heme/Allergies: Does not bruise/bleed easily.  Psychiatric/Behavioral: Negative for depression. The patient is not nervous/anxious and does not have insomnia.     Objective: BP 110/72 (BP Location: Left Arm, Patient Position: Sitting)   Pulse 77   Ht 5' 7.5" (1.715 m)   Wt 135 lb (61.2 kg)   BMI 20.83 kg/m   Filed Weights   12/23/18 0906  Weight: 135 lb (61.2 kg)   Body mass index is 20.83 kg/m. Physical Exam Constitutional:      General: She is not in acute distress.    Appearance: She is well-developed.  Genitourinary:     Pelvic exam was performed with patient supine.     Vagina, uterus and rectum normal.     No lesions in the vagina.     No vaginal bleeding.     No cervical motion tenderness, friability, lesion or polyp.     Uterus is mobile.     Uterus is not enlarged.     No uterine mass detected.    Uterus is midaxial.     No right or left adnexal  mass present.     Right adnexa not tender.     Left adnexa not tender.  HENT:     Head: Normocephalic and atraumatic. No laceration.     Right Ear: Hearing normal.     Left Ear: Hearing normal.     Mouth/Throat:     Pharynx: Uvula midline.  Eyes:     Pupils: Pupils are equal, round, and reactive to light.  Neck:     Musculoskeletal: Normal range of motion and neck supple.     Thyroid: No thyromegaly.  Cardiovascular:     Rate and Rhythm: Normal rate and regular rhythm.     Heart sounds: No murmur. No friction rub. No gallop.   Pulmonary:     Effort: Pulmonary effort is normal. No respiratory distress.     Breath sounds: Normal breath sounds.  No wheezing.  Chest:     Breasts:        Right: No mass, skin change or tenderness.        Left: No mass, skin change or tenderness.  Abdominal:     General: Bowel sounds are normal. There is no distension.     Palpations: Abdomen is soft.     Tenderness: There is no abdominal tenderness. There is no rebound.  Musculoskeletal: Normal range of motion.  Neurological:     Mental Status: She is alert and oriented to person, place, and time.     Cranial Nerves: No cranial nerve deficit.  Skin:    General: Skin is warm and dry.  Psychiatric:        Judgment: Judgment normal.  Vitals signs reviewed.    Assessment:  ANNUAL EXAM 1. Women's annual routine gynecological examination   2. IUD check up   3. Screening for thyroid disorder   4. Screening for diabetes mellitus   5. Screening for cholesterol level   6. Encounter for vitamin deficiency screening     Screening Plan:            1.  Cervical Screening-  Pap smear schedule reviewed with patient  2. Breast screening- Exam annually and mammogram>40 planned   3.  Labs To return fasting at a later date  4. Counseling for contraception: IUD   IUD check up- going well, cont for 5 years unless desires fertility   5. Screening for thyroid disorder - TSH; Future   6. Screening for diabetes mellitus - Glucose, fasting; Future   7. Screening for cholesterol level - Lipid panel; Future   8. Encounter for vitamin deficiency screening - VITAMIN D 25 Hydroxy (Vit-D Deficiency, Fractures); Future      F/U  Return in about 1 year (around 12/23/2019) for Annual, ALSO lab appt soon.  Barnett Applebaum, MD, [facility], [facility] 12/23/2018  9:25 AM

## 2018-12-23 NOTE — Patient Instructions (Signed)
Labs soon

## 2018-12-30 ENCOUNTER — Other Ambulatory Visit: Payer: BC Managed Care – PPO

## 2019-02-15 ENCOUNTER — Encounter: Payer: Self-pay | Admitting: Obstetrics and Gynecology

## 2019-02-15 ENCOUNTER — Other Ambulatory Visit: Payer: Self-pay

## 2019-02-15 ENCOUNTER — Ambulatory Visit (INDEPENDENT_AMBULATORY_CARE_PROVIDER_SITE_OTHER): Payer: BC Managed Care – PPO | Admitting: Obstetrics and Gynecology

## 2019-02-15 VITALS — BP 90/60 | Ht 68.0 in | Wt 133.0 lb

## 2019-02-15 DIAGNOSIS — Z23 Encounter for immunization: Secondary | ICD-10-CM | POA: Diagnosis not present

## 2019-02-15 DIAGNOSIS — N3000 Acute cystitis without hematuria: Secondary | ICD-10-CM

## 2019-02-15 LAB — POCT URINALYSIS DIPSTICK
Bilirubin, UA: NEGATIVE
Blood, UA: NEGATIVE
Glucose, UA: NEGATIVE
Ketones, UA: NEGATIVE
Nitrite, UA: NEGATIVE
Odor: POSITIVE
Protein, UA: NEGATIVE
Spec Grav, UA: 1.015 (ref 1.010–1.025)
pH, UA: 6 (ref 5.0–8.0)

## 2019-02-15 MED ORDER — NITROFURANTOIN MONOHYD MACRO 100 MG PO CAPS: 100.0000 mg | ORAL_CAPSULE | Freq: Two times a day (BID) | ORAL | 0 refills | Status: AC

## 2019-02-15 NOTE — Patient Instructions (Signed)
I value your feedback and entrusting us with your care. If you get a [facility] patient survey, I would appreciate you taking the time to let us know about your experience today. Thank you! 

## 2019-02-15 NOTE — Progress Notes (Signed)
Galen Manila, NP   Chief Complaint  Patient presents with  . Urinary Tract Infection    frequency and burning urinating, little odor, no blood in urine/pelvic or back pain since Sunday    HPI:      [name] is a 33 y.o. [phone] who LMP was No LMP recorded. (Menstrual status: IUD)., presents today for urinary frequency, urgency, urine odor, and dysuria for 4 days. No LBP, pelvic pain, fevers, vag sx. No meds to treat, no recent abx use. Hx of UTIs during pregnancy in past but none recently.   Past Medical History:  Diagnosis Date  . Medical history non-contributory     Past Surgical History:  Procedure Laterality Date  . CESAREAN SECTION  09/30/2014  . CESAREAN SECTION  08/13/2016  . CESAREAN SECTION MULTI-GESTATIONAL N/A 08/13/2016   Procedure: CESAREAN SECTION MULTI-GESTATIONAL;  Surgeon: Vena Austria, MD;  Location: [facility] ORS;  Service: Obstetrics;  Laterality: N/A;    Family History  Problem Relation Age of Onset  . Healthy Mother   . Healthy Father   . Healthy Maternal Grandmother   . Dementia Paternal Grandfather   . Breast cancer Neg Hx   . Colon cancer Neg Hx   . Ovarian cancer Neg Hx   . Heart disease Neg Hx   . Stroke Neg Hx   . Diabetes Neg Hx   . Hypertension Neg Hx     Social History   Socioeconomic History  . Marital status: Married    Spouse name: Not on file  . Number of children: Not on file  . Years of education: Not on file  . Highest education level: Not on file  Occupational History  . Not on file  Social Needs  . Financial resource strain: Not on file  . Food insecurity    Worry: Not on file    Inability: Not on file  . Transportation needs    Medical: Not on file    Non-medical: Not on file  Tobacco Use  . Smoking status: Never Smoker  . Smokeless tobacco: Never Used  Substance and Sexual Activity  . Alcohol use: No  . Drug use: No  . Sexual activity: Yes    Birth control/protection: I.U.D.    Comment:  Mirena  Lifestyle  . Physical activity    Days per week: Not on file    Minutes per session: Not on file  . Stress: Not on file  Relationships  . Social Musician on phone: Not on file    Gets together: Not on file    Attends religious service: Not on file    Active member of club or organization: Not on file    Attends meetings of clubs or organizations: Not on file    Relationship status: Not on file  . Intimate partner violence    Fear of current or ex partner: Not on file    Emotionally abused: Not on file    Physically abused: Not on file    Forced sexual activity: Not on file  Other Topics Concern  . Not on file  Social History Narrative  . Not on file    Outpatient Medications Prior to Visit  Medication Sig Dispense Refill  . levonorgestrel (MIRENA) 20 MCG/24HR IUD 1 each by Intrauterine route once.     No facility-administered medications prior to visit.       ROS:  Review of Systems  Constitutional: Negative for fever.  Gastrointestinal: Negative for blood in stool, constipation, diarrhea, nausea and vomiting.  Genitourinary: Positive for dysuria, frequency and urgency. Negative for dyspareunia, flank pain, hematuria, vaginal bleeding, vaginal discharge and vaginal pain.  Musculoskeletal: Negative for back pain.  Skin: Negative for rash.     OBJECTIVE:   Vitals:  BP 90/60   Ht 5\' 8"  (1.727 m)   Wt 133 lb (60.3 kg)   BMI 20.22 kg/m   Physical Exam Vitals signs reviewed.  Constitutional:      Appearance: She is well-developed. She is not ill-appearing or toxic-appearing.  Neck:     Musculoskeletal: Normal range of motion.  Pulmonary:     Effort: Pulmonary effort is normal.  Abdominal:     Tenderness: There is no right CVA tenderness or left CVA tenderness.  Musculoskeletal: Normal range of motion.  Neurological:     General: No focal deficit present.     Mental Status: She is alert and oriented to person, place, and time.      Cranial Nerves: No cranial nerve deficit.  Psychiatric:        Behavior: Behavior normal.        Thought Content: Thought content normal.        Judgment: Judgment normal.     Results: Results for orders placed or performed in visit on 02/15/19 (from the past 24 hour(s))  POCT Urinalysis Dipstick     Status: Abnormal   Collection Time: 02/15/19  3:01 PM  Result Value Ref Range   Color, UA yellow    Clarity, UA clear    Glucose, UA Negative Negative   Bilirubin, UA neg    Ketones, UA neg    Spec Grav, UA 1.015 1.010 - 1.025   Blood, UA neg    pH, UA 6.0 5.0 - 8.0   Protein, UA Negative Negative   Urobilinogen, UA     Nitrite, UA neg    Leukocytes, UA Small (1+) (A) Negative   Appearance     Odor pos      Assessment/Plan: Acute cystitis without hematuria - Plan: POCT Urinalysis Dipstick, nitrofurantoin, macrocrystal-monohydrate, (MACROBID) 100 MG capsule; pos sx/UA. Rx macrobid. Check C&S. F/u prn.   Meds ordered this encounter  Medications  . nitrofurantoin, macrocrystal-monohydrate, (MACROBID) 100 MG capsule    Sig: Take 1 capsule (100 mg total) by mouth 2 (two) times daily for 5 days.    Dispense:  10 capsule    Refill:  0    Order Specific Question:   Supervising Provider    Answer:   Gae Dry [[id number]]      Return if symptoms worsen or fail to improve.  Terrian Sentell B. Shanaya Schneck, PA-C 02/15/2019 3:02 PM

## 2019-02-15 NOTE — Addendum Note (Signed)
Addended by: Drenda Freeze on: 02/15/2019 03:33 PM   Modules accepted: Orders

## 2019-03-20 ENCOUNTER — Other Ambulatory Visit: Payer: Self-pay | Admitting: Obstetrics & Gynecology

## 2019-03-20 DIAGNOSIS — Z1321 Encounter for screening for nutritional disorder: Secondary | ICD-10-CM

## 2019-03-20 DIAGNOSIS — Z1329 Encounter for screening for other suspected endocrine disorder: Secondary | ICD-10-CM

## 2019-03-20 DIAGNOSIS — Z13220 Encounter for screening for lipoid disorders: Secondary | ICD-10-CM

## 2019-03-20 DIAGNOSIS — Z131 Encounter for screening for diabetes mellitus: Secondary | ICD-10-CM

## 2019-06-15 ENCOUNTER — Other Ambulatory Visit: Payer: Self-pay

## 2019-06-15 ENCOUNTER — Ambulatory Visit (INDEPENDENT_AMBULATORY_CARE_PROVIDER_SITE_OTHER): Payer: BC Managed Care – PPO | Admitting: Family Medicine

## 2019-06-15 ENCOUNTER — Encounter: Payer: Self-pay | Admitting: Family Medicine

## 2019-06-15 DIAGNOSIS — U071 COVID-19: Secondary | ICD-10-CM

## 2019-06-15 DIAGNOSIS — J9801 Acute bronchospasm: Secondary | ICD-10-CM | POA: Diagnosis not present

## 2019-06-15 DIAGNOSIS — J209 Acute bronchitis, unspecified: Secondary | ICD-10-CM

## 2019-06-15 MED ORDER — BENZONATATE 100 MG PO CAPS: 100.0000 mg | ORAL_CAPSULE | Freq: Three times a day (TID) | ORAL | 0 refills | Status: DC | PRN

## 2019-06-15 MED ORDER — ALBUTEROL SULFATE HFA 108 (90 BASE) MCG/ACT IN AERS: 2.0000 | INHALATION_SPRAY | RESPIRATORY_TRACT | 0 refills | Status: DC | PRN

## 2019-06-15 MED ORDER — AMOXICILLIN-POT CLAVULANATE 875-125 MG PO TABS: 1.0000 | ORAL_TABLET | Freq: Two times a day (BID) | ORAL | 0 refills | Status: DC

## 2019-06-15 NOTE — Progress Notes (Signed)
Virtual Visit via Telephone The purpose of this virtual visit is to provide medical care while limiting exposure to the novel coronavirus (COVID19) for both patient and office staff.  Consent was obtained for phone visit:  Yes.   Answered questions that patient had about telehealth interaction:  Yes.   I discussed the limitations, risks, security and privacy concerns of performing an evaluation and management service by telephone. I also discussed with the patient that there may be a patient responsible charge related to this service. The patient expressed understanding and agreed to proceed.  Patient Location: Home Provider Location: [facility])   ---------------------------------------------------------------------- Chief Complaint  Patient presents with  . COVID    pt was diagnose with COVID over week ago. She's concern that her symptoms are worsening and she might need an abx. She complains of productive cough, sore throat, runny nose, sinus drainage, and back pain.x 2 days     S: Reviewed CMA documentation. I have called patient and gathered additional HPI as follows:  COVID19 / Cough Bronchospasm Bronchitis Reports that symptoms started over a week ago with recent COVID19 diagnosis. She had initial symptoms with fatigue body aches flu-like symptoms and fever. Now has had fever free in 24 hours but now yesterday had new change with worsening of acute congestion and sore throat and some back pain associated and cough has been thicker green mucus and some blood tinged. Cough worse at night. Not short of breath but has the productive cough and some associated back pain.  Denies any high risk travel to areas of current concern for COVID19. Denies any known or suspected exposure to person with or possibly with COVID19.  Denies any fevers, chills, sweats, shortness of breath, headache, abdominal pain,  diarrhea  -------------------------------------------------------------------------- O: No physical exam performed due to remote telephone encounter.  -------------------------------------------------------------------------- A&P:   COVID19 Infection With associated now secondary acute bronchitis vs possible pneumonia now concern with progression of symptoms, consider 2nd sickening and cannot rule out bacterial infection.  - Reassuring without high risk symptoms - Afebrile, without dyspnea - No comorbid pulmonary conditions (asthma, COPD) or immunocompromise   1. She is already on COVID19 protocol from previous diagnosis 2. Due to concern with 2nd sickening now worsening symptoms - will cover empirically for Sinusitis / Lower respiratory tract infection with Augmentin BID x 10 days 3. Start Tessalon Perls take 1 capsule up to 3 times a day as needed for cough 4. Add Albuterol rescue inhaler PRN cough/bronchospasm at night 5. Use OTC cough medicine PRN if need, NSAID / Tylenol PRN 6. Defer chest x-ray today since symptoms mild and without dyspnea, we can consider scheduling her with Respiratory Clinic for in person evaluation or ordering X-ray outpatient if indicated if not improved. 7. Offered Prednisone steroid burst, she does not have asthma but seems to have night time bronchospasm, also with some back pains and aches it could help - she can call us back if wants to add this one later or in near future  Meds ordered this encounter  Medications  . albuterol (VENTOLIN HFA) 108 (90 Base) MCG/ACT inhaler    Sig: Inhale 2 puffs into the lungs every 4 (four) hours as needed for wheezing or shortness of breath (cough).    Dispense:  6.7 g    Refill:  0  . benzonatate (TESSALON) 100 MG capsule    Sig: Take 1 capsule (100 mg total) by mouth 3 (three) times daily as needed for cough.  Dispense:  30 capsule    Refill:  0  . amoxicillin-clavulanate (AUGMENTIN) 875-125 MG tablet    Sig:  Take 1 tablet by mouth 2 (two) times daily.    Dispense:  20 tablet    Refill:  0    For 10 days   REQUIRED self quarantine to [place] - advised to avoid all exposure with others while during treatment. Should continue to quarantine for up to 10-14 days - pending resolution of symptoms.  If symptoms do not resolve or significantly improve OR if WORSENING - fever / cough - or worsening shortness of breath - then should contact us and seek advice on next steps in treatment at home vs where/when to seek care at Urgent Care or Hospital ED for further intervention and possible testing if indicated.  Patient verbalizes understanding with the above medical recommendations including the limitation of remote medical advice.  Specific follow-up / call-back criteria were given for patient to follow-up or seek medical care more urgently if needed.   - Time spent in direct consultation with patient on phone: 9 minutes   Nobie Putnam, [facility] 06/15/2019, 10:16 AM

## 2019-06-15 NOTE — Patient Instructions (Addendum)
Concerned you could have underlying bacterial infection secondary to the COVID infection Start antibiotic now Start Tessalon Perls take 1 capsule up to 3 times a day as needed for cough  - also for cough-  Use Albuterol inhaler 2 puffs every 4-6 hours as needed - especially at NIGHT for cough (ask pharmacist to demonstrate proper inhaler use)  - Drink plenty of fluids to improve congestion - You may try over the counter Nasal Saline spray (Simply Saline, Ocean Spray) as needed to reduce congestion. - May take Tylenol (up to 500-1000mg  per dose) 3 times a day as needed for aches and fevers. If it is safe for you to take ibuprofen or advil, you may take these medicines as needed as well.   IF NOT IMPROVED or some difficulty breathing/cough or still back pain - CALL or message [place] and we can add Prednisone steroid Prednisone 50mg  daily for next 5 days - this will open up lungs allow you to breath better and treat that wheezing or bronchospasm  We can also schedule you to see a doctor [facility] to [facility] in office through the Respiratory Clinic in [place] or next week in [place]. Call our office to arrange this if you don't improve.  REQUIRED self quarantine to AVOID POTENTIAL SPREAD - advised to avoid all exposure with others while during treatment. Should continue to quarantine for up to 10-14 days - pending resolution of symptoms.  If symptoms do not resolve or significantly improve OR if WORSENING - fever / cough - or worsening shortness of breath - then should contact [place] and seek advice on next steps in treatment at home vs where/when to seek care at Urgent Care or Hospital ED for further intervention and possible testing if indicated.   Please schedule a Follow-up Appointment to: Return in about 1 week (around 06/22/2019), or if symptoms worsen or fail to improve, for covid / bronchitis.  If you have any other questions or concerns, please feel free to call the office or send a message through  MyChart. You may also schedule an earlier appointment if necessary.  Additionally, you may be receiving a survey about your experience at our office within a few days to 1 week by e-mail or mail. We value your feedback.  06/24/2019, DO [facility], [facility]

## 2019-07-28 ENCOUNTER — Ambulatory Visit: Payer: BC Managed Care – PPO | Attending: Internal Medicine

## 2019-07-28 DIAGNOSIS — Z23 Encounter for immunization: Secondary | ICD-10-CM | POA: Insufficient documentation

## 2019-07-28 NOTE — Progress Notes (Signed)
   Covid-19 Vaccination Clinic  Name:  KORALINE PHILLIPSON    MRN: [id number] DOB: 1986-03-06  07/28/2019  Ms. Givhan was observed post Covid-19 immunization for 15 minutes without incident. She was provided with Vaccine Information Sheet and instruction to access the V-Safe system.   Ms. Horn was instructed to call 911 with any severe reactions post vaccine: Marland Kitchen Difficulty breathing  . Swelling of face and throat  . A fast heartbeat  . A bad rash all over body  . Dizziness and weakness   Immunizations Administered    Name Date Dose VIS Date Route   Pfizer COVID-19 Vaccine 07/28/2019 10:31 AM 0.3 mL 05/05/2019 Intramuscular   Manufacturer: [employer], [employer]   Lot: [id number]   NDC: [address]-7

## 2019-08-18 ENCOUNTER — Ambulatory Visit: Payer: BC Managed Care – PPO

## 2019-08-22 ENCOUNTER — Ambulatory Visit: Payer: BC Managed Care – PPO | Attending: Internal Medicine

## 2019-08-22 DIAGNOSIS — Z23 Encounter for immunization: Secondary | ICD-10-CM

## 2019-08-22 NOTE — Progress Notes (Signed)
   Covid-19 Vaccination Clinic  Name:  Chloe Kelley    MRN: [id number] DOB: 07-04-1985  08/22/2019  [name] was observed post Covid-19 immunization for 15 minutes without incident. She was provided with Vaccine Information Sheet and instruction to access the V-Safe system.   [name] was instructed to call 911 with any severe reactions post vaccine: Marland Kitchen Difficulty breathing  . Swelling of face and throat  . A fast heartbeat  . A bad rash all over body  . Dizziness and weakness   Immunizations Administered    Name Date Dose VIS Date Route   Pfizer COVID-19 Vaccine 08/22/2019  1:28 PM 0.3 mL 05/05/2019 Intramuscular   Manufacturer: [employer], [employer]   Lot: [id number]   NDC: [address]-0

## 2020-03-07 ENCOUNTER — Other Ambulatory Visit: Payer: Self-pay

## 2020-03-07 ENCOUNTER — Ambulatory Visit (INDEPENDENT_AMBULATORY_CARE_PROVIDER_SITE_OTHER): Payer: BC Managed Care – PPO | Admitting: Family Medicine

## 2020-03-07 ENCOUNTER — Encounter: Payer: Self-pay | Admitting: Family Medicine

## 2020-03-07 VITALS — BP 100/64 | HR 68 | Temp 98.9°F | Resp 17 | Ht 68.0 in | Wt 130.2 lb

## 2020-03-07 DIAGNOSIS — Z0000 Encounter for general adult medical examination without abnormal findings: Secondary | ICD-10-CM | POA: Insufficient documentation

## 2020-03-07 DIAGNOSIS — R490 Dysphonia: Secondary | ICD-10-CM | POA: Diagnosis not present

## 2020-03-07 DIAGNOSIS — B353 Tinea pedis: Secondary | ICD-10-CM | POA: Insufficient documentation

## 2020-03-07 DIAGNOSIS — Z23 Encounter for immunization: Secondary | ICD-10-CM | POA: Insufficient documentation

## 2020-03-07 LAB — POCT URINALYSIS DIPSTICK
Bilirubin, UA: NEGATIVE
Blood, UA: NEGATIVE
Glucose, UA: NEGATIVE
Ketones, UA: NEGATIVE
Leukocytes, UA: NEGATIVE
Nitrite, UA: NEGATIVE
Protein, UA: NEGATIVE
Spec Grav, UA: 1.01 (ref 1.010–1.025)
Urobilinogen, UA: 0.2 E.U./dL
pH, UA: 5 (ref 5.0–8.0)

## 2020-03-07 MED ORDER — TERBINAFINE HCL 250 MG PO TABS: 250.0000 mg | ORAL_TABLET | Freq: Every day | ORAL | 0 refills | Status: DC

## 2020-03-07 NOTE — Assessment & Plan Note (Signed)
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  Recovers over the weekend and then the same symptoms return when she goes back to work.  Requesting referral to ENT.  Plan: 1. Referral to ENT placed

## 2020-03-07 NOTE — Patient Instructions (Signed)
We have sent your blood work to the lab today and will contact you when we receive the results.  I have sent in a prescription for terbinafine to take 1 tablet daily for the next 12 weeks.  It is important that we repeat your labs for liver function enzymes in 4 weeks and again in 8 weeks.  Well Visit: Care Instructions Overview  Well visits can help you stay healthy. Your provider has checked your overall health and may have suggested ways to take good care of yourself. Your provider also may have recommended tests. At home, you can help prevent illness with healthy eating, regular exercise, and other steps.  Follow-up care is a key part of your treatment and safety. Be sure to make and go to all appointments, and call your provider if you are having problems. It's also a good idea to know your test results and keep a list of the medicines you take.  How can you care for yourself at home?   Get screening tests that you and your doctor decide on. Screening helps find diseases before any symptoms appear.   Eat healthy foods. Choose fruits, vegetables, whole grains, protein, and low-fat dairy foods. Limit fat, especially saturated fat. Reduce salt in your diet.   Limit alcohol. If you are a man, have no more than 2 drinks a day or 14 drinks a week. If you are a woman, have no more than 1 drink a day or 7 drinks a week.   Get at least 30 minutes of physical activity on most days of the week.  We recommend you go no more than 2 days in a row without exercise. Walking is a good choice. You also may want to do other activities, such as running, swimming, cycling, or playing tennis or team sports. Discuss any changes in your exercise program with your provider.   Reach and stay at a healthy weight. This will lower your risk for many problems, such as obesity, diabetes, heart disease, and high blood pressure.   Do not smoke or allow others to smoke around you. If you need help quitting, talk to  your provider about stop-smoking programs and medicines. These can increase your chances of quitting for good.  Can call 1-800-QUIT-NOW (808 252 5528) for the [facility], assistance with smoking cessation.   Care for your mental health. It is easy to get weighed down by worry and stress. Learn strategies to manage stress, like deep breathing and mindfulness, and stay connected with your family and community. If you find you often feel sad or hopeless, talk with your provider. Treatment can help.   Talk to your provider about whether you have any risk factors for sexually transmitted infections (STIs). You can help prevent STIs if you wait to have sex with a new partner (or partners) until you've each been tested for STIs. It also helps if you use condoms (female or female condoms) and if you limit your sex partners to one person who only has sex with you. Vaccines are available for some STIs, such as HPV (these are age dependent).   Use birth control if it's important to you to prevent pregnancy. Talk with your provider about the choices available and what might be best for you.   If you think you may have a problem with alcohol or drug use, talk to your provider. This includes prescription medicines (such as amphetamines and opioids) and illegal drugs (such as cocaine and methamphetamine). Your provider can help  you figure out what type of treatment is best for you.   If you have concerns about domestic violence or intimate partner violence, there are resources available to you. National Domestic Abuse Hotline 475-722-7397   Protect your skin from too much sun. When you're outdoors from 10 a.m. to 4 p.m., stay in the shade or cover up with clothing and a hat with a wide brim. Wear sunglasses that block UV rays. Even when it's cloudy, put broad-spectrum sunscreen (SPF 30 or higher) on any exposed skin.   See a dentist one or two times a year for checkups and to have your teeth  cleaned.   See an eye doctor once per year for an eye exam.   Wear a seat belt in the car.  When should you call for help?  Watch closely for changes in your health, and be sure to contact your provider if you have any problems or symptoms that concern you.  We will plan to see you back in 1 year for your physical  You will receive a survey after today's visit either digitally by e-mail or paper by USPS mail. Your experiences and feedback matter to [place].  Please respond so we know how we are doing as we provide care for you.  Call us with any questions/concerns/needs.  It is my goal to be available to you for your health concerns.  Thanks for choosing me to be a partner in your healthcare needs!  Charlaine Dalton, FNP-C Family Nurse Practitioner [facility] Phone: [phone]

## 2020-03-07 NOTE — Assessment & Plan Note (Signed)
Annual physical exam with new findings of left great toe tinea pedia and hoarse voice.    Plan: 1. Obtain health maintenance screenings as above according to age. - Increase physical activity to 30 minutes most days of the week.  - Eat healthy diet high in vegetables and fruits; low in refined carbohydrates. - Screening labs and tests as ordered 2. Return 1 year for annual physical.

## 2020-03-07 NOTE — Progress Notes (Signed)
Subjective:    Patient ID: Chloe Kelley, female    DOB: 17-May-1986, 34 y.o.   MRN: [id number]  Chloe RecordsSarah C Kelley is a 34 y.o. female presenting on 03/07/2020 for Annual Exam   HPI   [name] presents to clinic for annual exam today  HEALTH MAINTENANCE:  Weight/BMI: Normal, BMI 19.80% Physical activity: Stays active Diet: Regular Seatbelt: Yes  Sunscreen: Yes PAP: Completed 12/06/2017, Due 12/06/2020 HIV Screening: Completed 07/29/2016 GC/CT: Offered and declined  Optometry: Yearly Dentistry: Every 6 months   IMMUNIZATIONS: Tetanus: Up to date, given 08/16/2016 Influenza: Due, given today COVID: Up to date, Pfizer 07/28/2019, 08/22/2019  [facility] 03/07/2020 12/14/2016  Decreased Interest 0 0  Down, Depressed, Hopeless 0 0  PHQ - 2 Score 0 0  Altered sleeping - 0  Tired, decreased energy - 0  Change in appetite - 0  Feeling bad or failure about yourself  - 0  Trouble concentrating - 0  Moving slowly or fidgety/restless - 0  Suicidal thoughts - 0  PHQ-9 Score - 0    Past Medical History:  Diagnosis Date  . Medical history non-contributory    Past Surgical History:  Procedure Laterality Date  . CESAREAN SECTION  09/30/2014  . CESAREAN SECTION  08/13/2016  . CESAREAN SECTION MULTI-GESTATIONAL N/A 08/13/2016   Procedure: CESAREAN SECTION MULTI-GESTATIONAL;  Surgeon: Vena AustriaAndreas Staebler, MD;  Location: [facility] ORS;  Service: Obstetrics;  Laterality: N/A;   Social History   Socioeconomic History  . Marital status: Married    Spouse name: Not on file  . Number of children: Not on file  . Years of education: Not on file  . Highest education level: Not on file  Occupational History  . Not on file  Tobacco Use  . Smoking status: Never Smoker  . Smokeless tobacco: Never Used  Vaping Use  . Vaping Use: Never used  Substance and Sexual Activity  . Alcohol use: Yes    Comment: occasionally  . Drug use: No  . Sexual activity: Yes    Birth  control/protection: I.U.D.    Comment: Mirena  Other Topics Concern  . Not on file  Social History Narrative  . Not on file   Social Determinants of Health   Financial Resource Strain:   . Difficulty of Paying Living Expenses: Not on file  Food Insecurity:   . Worried About Programme researcher, broadcasting/film/videounning Out of Food in the Last Year: Not on file  . Ran Out of Food in the Last Year: Not on file  Transportation Needs:   . Lack of Transportation (Medical): Not on file  . Lack of Transportation (Non-Medical): Not on file  Physical Activity:   . Days of Exercise per Week: Not on file  . Minutes of Exercise per Session: Not on file  Stress:   . Feeling of Stress : Not on file  Social Connections:   . Frequency of Communication with Friends and Family: Not on file  . Frequency of Social Gatherings with Friends and Family: Not on file  . Attends Religious Services: Not on file  . Active Member of Clubs or Organizations: Not on file  . Attends BankerClub or Organization Meetings: Not on file  . Marital Status: Not on file  Intimate Partner Violence:   . Fear of Current or Ex-Partner: Not on file  . Emotionally Abused: Not on file  . Physically Abused: Not on file  . Sexually Abused: Not on file   Family History  Problem Relation  Age of Onset  . Healthy Mother   . Healthy Father   . Healthy Maternal Grandmother   . Dementia Paternal Grandfather   . Breast cancer Neg Hx   . Colon cancer Neg Hx   . Ovarian cancer Neg Hx   . Heart disease Neg Hx   . Stroke Neg Hx   . Diabetes Neg Hx   . Hypertension Neg Hx    Current Outpatient Medications on File Prior to Visit  Medication Sig  . levonorgestrel (MIRENA) 20 MCG/24HR IUD 1 each by Intrauterine route once.   No current facility-administered medications on file prior to visit.    Per HPI unless specifically indicated above     Objective:    BP 100/64 (BP Location: Right Arm, Patient Position: Sitting, Cuff Size: Normal)   Pulse 68   Temp 98.9 F  (37.2 C) (Oral)   Resp 17   Ht 5\' 8"  (1.727 m)   Wt 130 lb 3.2 oz (59.1 kg)   SpO2 100%   BMI 19.80 kg/m   Wt Readings from Last 3 Encounters:  03/07/20 130 lb 3.2 oz (59.1 kg)  02/15/19 133 lb (60.3 kg)  12/23/18 135 lb (61.2 kg)    Physical Exam Vitals and nursing note reviewed.  Constitutional:      General: She is not in acute distress.    Appearance: Normal appearance. She is well-developed, well-groomed and normal weight. She is not ill-appearing or toxic-appearing.  HENT:     Head: Normocephalic and atraumatic.     Right Ear: Tympanic membrane, ear canal and external ear normal. There is no impacted cerumen.     Left Ear: Tympanic membrane, ear canal and external ear normal. There is no impacted cerumen.     Nose: Nose normal. No congestion or rhinorrhea.     Mouth/Throat:     Lips: Pink.     Mouth: Mucous membranes are moist.     Pharynx: Oropharynx is clear. Uvula midline. No oropharyngeal exudate or posterior oropharyngeal erythema.  Eyes:     General: Lids are normal. Vision grossly intact. No scleral icterus.       Right eye: No discharge.        Left eye: No discharge.     Extraocular Movements: Extraocular movements intact.     Conjunctiva/sclera: Conjunctivae normal.     Pupils: Pupils are equal, round, and reactive to light.  Neck:     Thyroid: No thyroid mass or thyromegaly.  Cardiovascular:     Rate and Rhythm: Normal rate and regular rhythm.     Pulses: Normal pulses.          Dorsalis pedis pulses are 2+ on the right side and 2+ on the left side.     Heart sounds: Normal heart sounds. No murmur heard.  No friction rub. No gallop.   Pulmonary:     Effort: Pulmonary effort is normal. No respiratory distress.     Breath sounds: Normal breath sounds.  Abdominal:     General: Abdomen is flat. Bowel sounds are normal. There is no distension.     Palpations: Abdomen is soft. There is no hepatomegaly, splenomegaly or mass.     Tenderness: There is no  abdominal tenderness. There is no guarding or rebound.     Hernia: No hernia is present.  Musculoskeletal:        General: Normal range of motion.     Cervical back: Normal range of motion and neck supple. No tenderness.  Right lower leg: No edema.     Left lower leg: No edema.     Comments: Normal tone, strength 5/5 BUE & BLE  Feet:     Right foot:     Skin integrity: Skin integrity normal.     Toenail Condition: Right toenails are normal.     Left foot:     Skin integrity: Skin integrity normal.     Toenail Condition: Fungal disease present. Lymphadenopathy:     Cervical: No cervical adenopathy.  Skin:    General: Skin is warm and dry.     Capillary Refill: Capillary refill takes less than 2 seconds.  Neurological:     General: No focal deficit present.     Mental Status: She is alert and oriented to person, place, and time.     Cranial Nerves: No cranial nerve deficit.     Sensory: No sensory deficit.     Motor: No weakness.     Coordination: Coordination normal.     Gait: Gait normal.     Deep Tendon Reflexes: Reflexes normal.  Psychiatric:        Attention and Perception: Attention and perception normal.        Mood and Affect: Mood and affect normal.        Speech: Speech normal.        Behavior: Behavior normal. Behavior is cooperative.        Thought Content: Thought content normal.        Cognition and Memory: Cognition and memory normal.        Judgment: Judgment normal.     Results for orders placed or performed in visit on 03/07/20  POCT Urinalysis Dipstick  Result Value Ref Range   Color, UA yellow    Clarity, UA clear    Glucose, UA Negative Negative   Bilirubin, UA negative    Ketones, UA negative    Spec Grav, UA 1.010 1.010 - 1.025   Blood, UA negative    pH, UA 5.0 5.0 - 8.0   Protein, UA Negative Negative   Urobilinogen, UA 0.2 0.2 or 1.0 E.U./dL   Nitrite, UA negative    Leukocytes, UA Negative Negative   Appearance     Odor          Assessment & Plan:   Problem List Items Addressed This Visit      Musculoskeletal and Integument   Tinea pedis of left foot    Left great toe with tinea pedis.  Will treat with oral terbinafine 250mg  daily for the next 12 weeks, with labs drawn in 4 and 8 weeks for liver enzyme evaluation.  Plan: 1. Begin terbinafine 250mg  daily x 12 weeks 2. CMP drawn today and sent to lab for evaluation 3. RTC in 4 & 8 weeks for labs to be drawn      Relevant Medications   terbinafine (LAMISIL) 250 MG tablet   Other Relevant Orders   COMPLETE METABOLIC PANEL WITH GFR     Other   Annual physical exam - Primary    Annual physical exam with new findings of left great toe tinea pedia and hoarse voice.    Plan: 1. Obtain health maintenance screenings as above according to age. - Increase physical activity to 30 minutes most days of the week.  - Eat healthy diet high in vegetables and fruits; low in refined carbohydrates. - Screening labs and tests as ordered 2. Return 1 year for annual physical.  Relevant Orders   POCT Urinalysis Dipstick (Completed)   Flu vaccine need    Pt < age 5.  Needs annual influenza vaccine.  VIS provided.  Plan: 1. Administer Quad flu vaccine.       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  Recovers over the weekend and then the same symptoms return when she goes back to work.  Requesting referral to ENT.  Plan: 1. Referral to ENT placed      Relevant Orders   Ambulatory referral to ENT      Meds ordered this encounter  Medications  . terbinafine (LAMISIL) 250 MG tablet    Sig: Take 1 tablet (250 mg total) by mouth daily.    Dispense:  90 tablet    Refill:  0    Follow up plan: Return in about 1 year (around 03/07/2021) for CPE.  Charlaine Dalton, FNP-C Family Nurse Practitioner [facility] 03/07/2020, 4:33 PM

## 2020-03-07 NOTE — Assessment & Plan Note (Signed)
Left great toe with tinea pedis.  Will treat with oral terbinafine 250mg  daily for the next 12 weeks, with labs drawn in 4 and 8 weeks for liver enzyme evaluation.  Plan: 1. Begin terbinafine 250mg  daily x 12 weeks 2. CMP drawn today and sent to lab for evaluation 3. RTC in 4 & 8 weeks for labs to be drawn

## 2020-03-07 NOTE — Assessment & Plan Note (Signed)
Pt < age 34.  Needs annual influenza vaccine.  VIS provided.  Plan: 1. Administer Quad flu vaccine.  

## 2020-03-08 LAB — COMPLETE METABOLIC PANEL WITH GFR
AG Ratio: 2.4 (calc) (ref 1.0–2.5)
ALT: 9 U/L (ref 6–29)
AST: 12 U/L (ref 10–30)
Albumin: 4.6 g/dL (ref 3.6–5.1)
Alkaline phosphatase (APISO): 29 U/L — ABNORMAL LOW (ref 31–125)
BUN: 13 mg/dL (ref 7–25)
CO2: 26 mmol/L (ref 20–32)
Calcium: 9.2 mg/dL (ref 8.6–10.2)
Chloride: 104 mmol/L (ref 98–110)
Creat: 0.69 mg/dL (ref 0.50–1.10)
GFR, Est African American: 132 mL/min/{1.73_m2} (ref >=60)
GFR, Est Non African American: 114 mL/min/{1.73_m2} (ref >=60)
Globulin: 1.9 g/dL (calc) (ref 1.9–3.7)
Glucose, Bld: 82 mg/dL (ref 65–99)
Potassium: 4.1 mmol/L (ref 3.5–5.3)
Sodium: 138 mmol/L (ref 135–146)
Total Bilirubin: 1.1 mg/dL (ref 0.2–1.2)
Total Protein: 6.5 g/dL (ref 6.1–8.1)

## 2020-06-26 NOTE — Telephone Encounter (Signed)
Patient decided Mirena (rcvd/charged 05/27/2018)

## 2020-11-14 ENCOUNTER — Telehealth: Payer: BC Managed Care – PPO | Admitting: Physician Assistant

## 2020-11-14 ENCOUNTER — Telehealth: Payer: BC Managed Care – PPO

## 2020-11-14 DIAGNOSIS — H9201 Otalgia, right ear: Secondary | ICD-10-CM

## 2020-11-14 MED ORDER — NEOMYCIN-POLYMYXIN-HC 3.5-10000-1 OP SUSP: OPHTHALMIC | 0 refills | Status: DC

## 2020-11-14 NOTE — Progress Notes (Signed)
E Visit for Swimmer's Ear  We are sorry that you are not feeling well. Here is how we plan to help!  Based on what you have shared with me it looks like you have swimmers ear. Swimmer's ear is a redness or swelling, irritation, or infection of your outer ear canal.  These symptoms usually occur within a few days of swimming.  Your ear canal is a tube that goes from the opening of the ear to the eardrum.  When water stays in your ear canal, germs can grow.  This is a painful condition that often happens to children and swimmers of all ages.  It is not contagious and oral antibiotics are not required to treat uncomplicated swimmer's ear.  The usual symptoms include: Itching inside the ear, Redness or a sense of swelling in the ear, Pain when the ear is tugged on when pressure is placed on the ear, Pus draining from the infected ear. and I have prescribed: Neomycin 0.35%, polymyxin B 10,000 units/mL, and hydrocortisone 0,5% otic solution 4 drops in affected ears four times a day for 7 days  In certain cases swimmer's ear may progress to a more serious bacterial infection of the middle or inner ear.  If you have a fever 102 and up and significantly worsening symptoms, this could indicate a more serious infection moving to the middle/inner and needs face to face evaluation in an office by a provider.  Your symptoms should improve over the next 3 days and should resolve in about 7 days.  HOME CARE:  Wash your hands frequently. Do not place the tip of the bottle on your ear or touch it with your fingers. You can take Acetominophen 650 mg every 4-6 hours as needed for pain.  If pain is severe or moderate, you can apply a heating pad (set on low) or hot water bottle (wrapped in a towel) to outer ear for 20 minutes.  This will also increase drainage. Avoid ear plugs Do not use Q-tips After showers, help the water run out by tilting your head to one side.  GET HELP RIGHT AWAY IF:  Fever is over 102.2  degrees. You develop progressive ear pain or hearing loss. Ear symptoms persist longer than 3 days after treatment.  MAKE SURE YOU:  Understand these instructions. Will watch your condition. Will get help right away if you are not doing well or get worse.  TO PREVENT SWIMMER'S EAR: Use a bathing cap or custom fitted swim molds to keep your ears dry. Towel off after swimming to dry your ears. Tilt your head or pull your earlobes to allow the water to escape your ear canal. If there is still water in your ears, consider using a hairdryer on the lowest setting.  Thank you for choosing an e-visit. Your e-visit answers were reviewed by a board certified advanced clinical practitioner to complete your personal care plan. Depending upon the condition, your plan could have included both over the counter or prescription medications. Please review your pharmacy choice. Be sure that the pharmacy you have chosen is open so that you can pick up your prescription now.  If there is a problem you may message your provider in MyChart to have the prescription routed to another pharmacy. Your safety is important to [place]. If you have drug allergies check your prescription carefully.  For the next 24 hours, you can use MyChart to ask questions about today's visit, request a non-urgent call back, or ask for a work  or school excuse from your e-visit provider. You will get an email in the next two days asking about your experience. I hope that your e-visit has been valuable and will speed your recovery.   I provided 5 minutes of non face-to-face time during this encounter for chart review and documentation.

## 2020-11-29 ENCOUNTER — Other Ambulatory Visit: Payer: Self-pay

## 2020-11-29 ENCOUNTER — Encounter: Payer: Self-pay | Admitting: Family Medicine

## 2020-11-29 ENCOUNTER — Ambulatory Visit (INDEPENDENT_AMBULATORY_CARE_PROVIDER_SITE_OTHER): Payer: BC Managed Care – PPO | Admitting: Family Medicine

## 2020-11-29 VITALS — BP 90/58 | HR 71 | Ht 68.0 in | Wt 131.6 lb

## 2020-11-29 DIAGNOSIS — B379 Candidiasis, unspecified: Secondary | ICD-10-CM

## 2020-11-29 DIAGNOSIS — H65191 Other acute nonsuppurative otitis media, right ear: Secondary | ICD-10-CM

## 2020-11-29 DIAGNOSIS — H6981 Other specified disorders of Eustachian tube, right ear: Secondary | ICD-10-CM

## 2020-11-29 DIAGNOSIS — T3695XA Adverse effect of unspecified systemic antibiotic, initial encounter: Secondary | ICD-10-CM

## 2020-11-29 MED ORDER — FLUCONAZOLE 150 MG PO TABS: ORAL_TABLET | ORAL | 0 refills | Status: DC

## 2020-11-29 MED ORDER — FLUTICASONE PROPIONATE 50 MCG/ACT NA SUSP: 2.0000 | Freq: Every day | NASAL | 3 refills | Status: DC

## 2020-11-29 NOTE — Patient Instructions (Addendum)
Thank you for coming to the office today.  Take Diflucan for Yeast infection.  You have some Eustachian Tube Dysfunction, this problem is usually caused by some deeper sinus swelling and pressure, causing difficulty of eustachian tubes to clear fluid from behind ear drum. You can have ear pain, pressure, fullness, loss of hearing. Often related to sinus symptoms and sometimes with sinusitis or infection or allergy symptoms.  Treatment: - Start using nasal steroid spray, Flonase 2 sprays in each nostril every day for at least 4-6 weeks, and maybe longer - Start OTC allergy medicine (Claritin, Zyrtec, or Allegra - or generics) once daily - For significant ear/sinus pressure, try OTC decongestant such as Phenylephrine or similar medicines, included in DayQuil, take this for up to 1 week or less, no longer  Look into "Galbreath Technique" for ear effusion, can do this simple massage 1-2 x daily for few days then stop. And use as needed. May also use a warm moist wash cloth to help loosen up this tissue before or after doing this to help open up eustachian tube.  If any significant worsening, loss of hearing, constant pain, fever/chills, or concern for infection - notify office and we can send in an antibiotic. Or if just persistent pressure that is not improving, you may contact me back within 1 week and we can consider a brief course of oral steroid prednisone for 3 days only to help reduce swelling, as discussed this is not ideal treatment and can cause side effects.   Please schedule a Follow-up Appointment to: Return if symptoms worsen or fail to improve.  If you have any other questions or concerns, please feel free to call the office or send a message through MyChart. You may also schedule an earlier appointment if necessary.  Additionally, you may be receiving a survey about your experience at our office within a few days to 1 week by e-mail or mail. We value your feedback.  Saralyn Pilar, DO [facility], [place]  Eustachian Tube Dysfunction  Eustachian tube dysfunction refers to a condition in which a blockage develops in the narrow passage that connects the middle ear to the back of the nose (eustachian tube). The eustachian tube regulates air pressure in the middle ear by letting air move between the ear and nose. It also helps to drain fluid from the middle earspace. Eustachian tube dysfunction can affect one or both ears. When the eustachian tube does not function properly, air pressure, fluid, or both can build up inthe middle ear. What are the causes? This condition occurs when the eustachian tube becomes blocked or cannot open normally. Common causes of this condition include: Ear infections. Colds and other infections that affect the nose, mouth, and throat (upper respiratory tract). Allergies. Irritation from cigarette smoke. Irritation from stomach acid coming up into the esophagus (gastroesophageal reflux). The esophagus is the tube that carries food from the mouth to the stomach. Sudden changes in air pressure, such as from descending in an airplane or scuba diving. Abnormal growths in the nose or throat, such as: Growths that line the nose (nasal polyps). Abnormal growth of cells (tumors). Enlarged tissue at the back of the throat (adenoids). What increases the risk? You are more likely to develop this condition if: You smoke. You are overweight. You are a child who has: Certain birth defects of the mouth, such as cleft palate. Large tonsils or adenoids. What are the signs or symptoms? Common symptoms of this condition include: A  feeling of fullness in the ear. Ear pain. Clicking or popping noises in the ear. Ringing in the ear. Hearing loss. Loss of balance. Dizziness. Symptoms may get worse when the air pressure around you changes, such as whenyou travel to an area of high elevation, fly on an airplane, or go scuba  diving. How is this diagnosed? This condition may be diagnosed based on: Your symptoms. A physical exam of your ears, nose, and throat. Tests, such as those that measure: The movement of your eardrum (tympanogram). Your hearing (audiometry). How is this treated? Treatment depends on the cause and severity of your condition. In mild cases, you may relieve your symptoms by moving air into your ears. This is called "popping the ears." In more severe cases, or if you have symptoms of fluid in your ears, treatment may include: Medicines to relieve congestion (decongestants). Medicines that treat allergies (antihistamines). Nasal sprays or ear drops that contain medicines that reduce swelling (steroids). A procedure to drain the fluid in your eardrum (myringotomy). In this procedure, a small tube is placed in the eardrum to: Drain the fluid. Restore the air in the middle ear space. A procedure to insert a balloon device through the nose to inflate the opening of the eustachian tube (balloon dilation). Follow these instructions at home: Lifestyle Do not do any of the following until your health care provider approves: Travel to high altitudes. Fly in airplanes. Work in a Estate agent or room. Scuba dive. Do not use any products that contain nicotine or tobacco, such as cigarettes and e-cigarettes. If you need help quitting, ask your health care provider. Keep your ears dry. Wear fitted earplugs during showering and bathing. Dry your ears completely after. General instructions Take over-the-counter and prescription medicines only as told by your health care provider. Use techniques to help pop your ears as recommended by your health care provider. These may include: Chewing gum. Yawning. Frequent, forceful swallowing. Closing your mouth, holding your nose closed, and gently blowing as if you are trying to blow air out of your nose. Keep all follow-up visits as told by your health care  provider. This is important. Contact a health care provider if: Your symptoms do not go away after treatment. Your symptoms come back after treatment. You are unable to pop your ears. You have: A fever. Pain in your ear. Pain in your head or neck. Fluid draining from your ear. Your hearing suddenly changes. You become very dizzy. You lose your balance. Summary Eustachian tube dysfunction refers to a condition in which a blockage develops in the eustachian tube. It can be caused by ear infections, allergies, inhaled irritants, or abnormal growths in the nose or throat. Symptoms include ear pain, hearing loss, or ringing in the ears. Mild cases are treated with maneuvers to unblock the ears, such as yawning or ear popping. Severe cases are treated with medicines. Surgery may also be done (rare). This information is not intended to replace advice given to you by your health care provider. Make sure you discuss any questions you have with your healthcare provider. Document Revised: 08/31/2017 Document Reviewed: 08/31/2017 Elsevier Patient Education  2022 [employer].

## 2020-11-29 NOTE — Progress Notes (Signed)
Subjective:    Patient ID: Chloe Kelley, female    DOB: [birth year], 35 y.o.   MRN: [id number]  Chloe Kelley is a 35 y.o. female presenting on 11/29/2020 for Ear Fullness and Vaginitis   PCP Danielle Rankin, FNP / Nicki Reaper, FNP   HPI  Right Ear Fullness / Eustachian Tube Dysfunction Acute R Ear Pain Reports initial onset acute on 11/14/20 she woke up with severe pain and throbbing R ear only, she did a [facility] Visit. Given antibiotic ear drops. She was doing swim lessons in a pool prior to that long hours. No regular ear problems or recurrent ear infections. She was given neomycin polymyxin antibiotic/steroid ear drops that seemed to have helped a lot and resolved the ear infection and pain. - Now reports new change with persistent "fullness or bubble" or clogged R ear. It was improving but never fully resolved. She does not admit to having ear pain. She did have an old rx Amoxicillin 500mg  took for 5 days last week. Denies any loss of hearing but admits some muffled pressure fullness that limits hearing, no symptoms on L ear. Denies any fever chills aches, headache, nausea vomiting sore throat cough, ear drainage  Yeast Vaginitis Reports symptoms this past week with white vaginal discharge, she took Amoxicillin last week, and then developed yeast infection.   Depression screen [facility] 2/9 11/29/2020 03/07/2020 12/14/2016  Decreased Interest 0 0 0  Down, Depressed, Hopeless 0 0 0  PHQ - 2 Score 0 0 0  Altered sleeping 0 - 0  Tired, decreased energy 0 - 0  Change in appetite 0 - 0  Feeling bad or failure about yourself  0 - 0  Trouble concentrating 0 - 0  Moving slowly or fidgety/restless 0 - 0  Suicidal thoughts 0 - 0  PHQ-9 Score 0 - 0  Difficult doing work/chores Not difficult at all - -    Social History   Tobacco Use   Smoking status: Never   Smokeless tobacco: Never  Vaping Use   Vaping Use: Never used  Substance Use Topics   Alcohol use: Yes    Comment: occasionally    Drug use: No    Review of Systems Per HPI unless specifically indicated above     Objective:    BP (!) 90/58   Pulse 71   Ht 5\' 8"  (1.727 m)   Wt 131 lb 9.6 oz (59.7 kg)   SpO2 100%   BMI 20.01 kg/m   Wt Readings from Last 3 Encounters:  11/29/20 131 lb 9.6 oz (59.7 kg)  03/07/20 130 lb 3.2 oz (59.1 kg)  02/15/19 133 lb (60.3 kg)    Physical Exam Vitals and nursing note reviewed.  Constitutional:      General: She is not in acute distress.    Appearance: Normal appearance. She is well-developed. She is not diaphoretic.     Comments: Well-appearing, comfortable, cooperative  HENT:     Head: Normocephalic and atraumatic.     Right Ear: Ear canal and external ear normal. There is no impacted cerumen.     Left Ear: Tympanic membrane, ear canal and external ear normal. There is no impacted cerumen.     Ears:     Comments: R TM with clear effusion and fullness, without abnormality of ext ear canal no purulence or erythema Eyes:     General:        Right eye: No discharge.  Left eye: No discharge.     Conjunctiva/sclera: Conjunctivae normal.  Cardiovascular:     Rate and Rhythm: Normal rate.  Pulmonary:     Effort: Pulmonary effort is normal.  Skin:    General: Skin is warm and dry.     Findings: No erythema or rash.  Neurological:     Mental Status: She is alert and oriented to person, place, and time.  Psychiatric:        Mood and Affect: Mood normal.        Behavior: Behavior normal.        Thought Content: Thought content normal.     Comments: Well groomed, good eye contact, normal speech and thoughts   Results for orders placed or performed in visit on 03/07/20  COMPLETE METABOLIC PANEL WITH GFR  Result Value Ref Range   Glucose, Bld 82 65 - 99 mg/dL   BUN 13 7 - 25 mg/dL   Creat 9.93 7.16 - 9.67 mg/dL   GFR, Est Non African American 114 > OR = 60 mL/min/1.77m2   GFR, Est African American 132 > OR = 60 mL/min/1.16m2   BUN/Creatinine Ratio NOT  APPLICABLE 6 - 22 (calc)   Sodium 138 135 - 146 mmol/L   Potassium 4.1 3.5 - 5.3 mmol/L   Chloride 104 98 - 110 mmol/L   CO2 26 20 - 32 mmol/L   Calcium 9.2 8.6 - 10.2 mg/dL   Total Protein 6.5 6.1 - 8.1 g/dL   Albumin 4.6 3.6 - 5.1 g/dL   Globulin 1.9 1.9 - 3.7 g/dL (calc)   AG Ratio 2.4 1.0 - 2.5 (calc)   Total Bilirubin 1.1 0.2 - 1.2 mg/dL   Alkaline phosphatase (APISO) 29 (L) 31 - 125 U/L   AST 12 10 - 30 U/L   ALT 9 6 - 29 U/L  POCT Urinalysis Dipstick  Result Value Ref Range   Color, UA yellow    Clarity, UA clear    Glucose, UA Negative Negative   Bilirubin, UA negative    Ketones, UA negative    Spec Grav, UA 1.010 1.010 - 1.025   Blood, UA negative    pH, UA 5.0 5.0 - 8.0   Protein, UA Negative Negative   Urobilinogen, UA 0.2 0.2 or 1.0 E.U./dL   Nitrite, UA negative    Leukocytes, UA Negative Negative   Appearance     Odor        Assessment & Plan:   Problem List Items Addressed This Visit   None Visit Diagnoses     Eustachian tube dysfunction, right    -  Primary   Relevant Medications   fluticasone (FLONASE) 50 MCG/ACT nasal spray   Antibiotic-induced yeast infection       Relevant Medications   fluconazole (DIFLUCAN) 150 MG tablet   Acute effusion of right ear       Relevant Medications   fluconazole (DIFLUCAN) 150 MG tablet       Acute vs subacute on R eustachian tube dysfunction with secondary  R ear effusion, without loss of hearing or evidence of AOM or sinusitis Prior AOM recently Completed antibiotic/steroid ear drops   Plan: 1. Start Flonase 2 sprays each nare daily for up to 4-6 weeks or longer 2. Start OTC oral anti-histamine daily 3. Recommend may consider trial OTC oral decongestant for up to 1 week or less 4. Additionally discussed potential benefit of oral steroid, prednisone taper for short course up to 20mg  daily x 3 days,  only if refractory symptoms, counseled on potential side effects  Follow-up if not improved or worsening,  return criteria  Recent yeast infection - will treat with Diflucan   Meds ordered this encounter  Medications   fluticasone (FLONASE) 50 MCG/ACT nasal spray    Sig: Place 2 sprays into both nostrils daily. Use for 4-6 weeks then stop and use seasonally or as needed.    Dispense:  16 g    Refill:  3   fluconazole (DIFLUCAN) 150 MG tablet    Sig: Take one tablet by mouth on Day 1. Repeat dose 2nd tablet on Day 3.    Dispense:  2 tablet    Refill:  0      Follow up plan: Return if symptoms worsen or fail to improve.    Saralyn Pilar, DO [facility] 11/29/2020, 9:04 AM

## 2021-06-16 ENCOUNTER — Telehealth: Payer: BC Managed Care – PPO | Admitting: Emergency Medicine

## 2021-06-16 DIAGNOSIS — J069 Acute upper respiratory infection, unspecified: Secondary | ICD-10-CM

## 2021-06-16 MED ORDER — BENZONATATE 100 MG PO CAPS: 100.0000 mg | ORAL_CAPSULE | Freq: Two times a day (BID) | ORAL | 0 refills | Status: DC | PRN

## 2021-06-16 MED ORDER — PREDNISONE 5 MG (21) PO TBPK: ORAL_TABLET | ORAL | 0 refills | Status: DC

## 2021-06-16 MED ORDER — AMOXICILLIN 500 MG PO CAPS: 500.0000 mg | ORAL_CAPSULE | Freq: Two times a day (BID) | ORAL | 0 refills | Status: AC

## 2021-06-16 MED ORDER — AZELASTINE HCL 0.1 % NA SOLN: 2.0000 | Freq: Two times a day (BID) | NASAL | 0 refills | Status: DC

## 2021-06-16 NOTE — Progress Notes (Signed)

## 2021-06-16 NOTE — Progress Notes (Signed)
E-Visit for Upper Respiratory Infection   We are sorry you are not feeling well.  Here is how we plan to help!  Based on what you have shared with me, it looks like you may have a viral upper respiratory infection.  Upper respiratory infections are caused by a large number of viruses; however, rhinovirus is the most common cause.   Symptoms vary from person to person, with common symptoms including sore throat, cough, fatigue or lack of energy and feeling of general discomfort.  A low-grade fever of up to 100.4 may present, but is often uncommon.  Symptoms vary however, and are closely related to a person's age or underlying illnesses.  The most common symptoms associated with an upper respiratory infection are nasal discharge or congestion, cough, sneezing, headache and pressure in the ears and face.  These symptoms usually persist for about 3 to 10 days, but can last up to 2 weeks.  It is important to know that upper respiratory infections do not cause serious illness or complications in most cases.    Upper respiratory infections can be transmitted from person to person, with the most common method of transmission being a person's hands.  The virus is able to live on the skin and can infect other persons for up to 2 hours after direct contact.  Also, these can be transmitted when someone coughs or sneezes; thus, it is important to cover the mouth to reduce this risk.  To keep the spread of the illness at [place], good hand hygiene is very important.  This is an infection that is most likely caused by a virus. There are no specific treatments other than to help you with the symptoms until the infection runs its course.  We are sorry you are not feeling well.  Here is how we plan to help!   For nasal congestion, you may use an oral decongestants such as Mucinex D or if you have glaucoma or high blood pressure use plain Mucinex.  Saline nasal spray or nasal drops can help and can safely be used as often as  needed for congestion.  For your congestion, I have prescribed Azelastine nasal spray two sprays in each nostril twice a day  If you do not have a history of heart disease, hypertension, diabetes or thyroid disease, prostate/bladder issues or glaucoma, you may also use Sudafed to treat nasal congestion.  It is highly recommended that you consult with a pharmacist or your primary care physician to ensure this medication is safe for you to take.     If you have a cough, you may use cough suppressants such as Delsym and Robitussin.  If you have glaucoma or high blood pressure, you can also use Coricidin HBP.   For cough I have prescribed for you A prescription cough medication called Tessalon Perles 100 mg. You may take 1-2 capsules every 8 hours as needed for cough  If you have a sore or scratchy throat, use a saltwater gargle-  to  teaspoon of salt dissolved in a 4-ounce to 8-ounce glass of warm water.  Gargle the solution for approximately 15-30 seconds and then spit.  It is important not to swallow the solution.  You can also use throat lozenges/cough drops and Chloraseptic spray to help with throat pain or discomfort.  Warm or cold liquids can also be helpful in relieving throat pain. I have also prescribed prednisone to help with your sore throat.    For headache, pain or general discomfort,  you can use Ibuprofen or Tylenol as directed.   Some authorities believe that zinc sprays or the use of Echinacea may shorten the course of your symptoms.   HOME CARE Only take medications as instructed by your medical team. Be sure to drink plenty of fluids. Water is fine as well as fruit juices, sodas and electrolyte beverages. You may want to stay away from caffeine or alcohol. If you are nauseated, try taking small sips of liquids. How do you know if you are getting enough fluid? Your urine should be a pale yellow or almost colorless. Get rest. Taking a steamy shower or using a humidifier may help  nasal congestion and ease sore throat pain. You can place a towel over your head and breathe in the steam from hot water coming from a faucet. Using a saline nasal spray works much the same way. Cough drops, hard candies and sore throat lozenges may ease your cough. Avoid close contacts especially the very young and the elderly Cover your mouth if you cough or sneeze Always remember to wash your hands.   GET HELP RIGHT AWAY IF: You develop worsening fever. If your symptoms do not improve within 10 days You develop yellow or green discharge from your nose over 3 days. You have coughing fits You develop a severe head ache or visual changes. You develop shortness of breath, difficulty breathing or start having chest pain Your symptoms persist after you have completed your treatment plan  MAKE SURE YOU  Understand these instructions. Will watch your condition. Will get help right away if you are not doing well or get worse.  Thank you for choosing an e-visit.  Your e-visit answers were reviewed by a board certified advanced clinical practitioner to complete your personal care plan. Depending upon the condition, your plan could have included both over the counter or prescription medications.  Please review your pharmacy choice. Make sure the pharmacy is open so you can pick up prescription now. If there is a problem, you may contact your provider through [employer] and have the prescription routed to another pharmacy.  Your safety is important to [place]. If you have drug allergies check your prescription carefully.   For the next 24 hours you can use MyChart to ask questions about today's visit, request a non-urgent call back, or ask for a work or school excuse. You will get an email in the next two days asking about your experience. I hope that your e-visit has been valuable and will speed your recovery.  I have spent 5 minutes in review of e-visit questionnaire, review and updating  patient chart, medical decision making and response to patient.   Willeen Cass, PhD, FNP-BC

## 2021-06-16 NOTE — Addendum Note (Signed)
Addended by: Margaretann Loveless on: 06/16/2021 12:07 PM   Modules accepted: Orders

## 2021-06-18 ENCOUNTER — Ambulatory Visit: Payer: BC Managed Care – PPO | Admitting: Family Medicine

## 2021-07-09 ENCOUNTER — Telehealth: Payer: BC Managed Care – PPO | Admitting: Physician Assistant

## 2021-07-09 DIAGNOSIS — J029 Acute pharyngitis, unspecified: Secondary | ICD-10-CM

## 2021-07-09 DIAGNOSIS — Z20818 Contact with and (suspected) exposure to other bacterial communicable diseases: Secondary | ICD-10-CM | POA: Diagnosis not present

## 2021-07-09 MED ORDER — AMOXICILLIN 500 MG PO TABS
500.0000 mg | ORAL_TABLET | Freq: Two times a day (BID) | ORAL | 0 refills | Status: DC
Start: 2021-07-09 — End: 2022-04-16

## 2021-07-09 NOTE — Progress Notes (Signed)
I have spent 5 minutes in review of e-visit questionnaire, review and updating patient chart, medical decision making and response to patient.   Ayisha Pol Cody Lester Crickenberger, PA-C    

## 2021-07-09 NOTE — Progress Notes (Signed)

## 2022-04-16 ENCOUNTER — Telehealth: Payer: BC Managed Care – PPO | Admitting: Family Medicine

## 2022-04-16 DIAGNOSIS — J020 Streptococcal pharyngitis: Secondary | ICD-10-CM | POA: Diagnosis not present

## 2022-04-16 MED ORDER — PENICILLIN V POTASSIUM 500 MG PO TABS: 500.0000 mg | ORAL_TABLET | Freq: Two times a day (BID) | ORAL | 0 refills | Status: AC

## 2022-04-16 NOTE — Progress Notes (Signed)
E-Visit for Sore Throat - Strep Symptoms  We are sorry that you are not feeling well.  Here is how we plan to help!  Based on what you have shared with me it is likely that you have strep pharyngitis.  Strep pharyngitis is inflammation and infection in the back of the throat.  This is an infection cause by bacteria and is treated with antibiotics.  I have prescribed Penicillin V 500 mg twice a day for 10 days. For throat pain, we recommend over the counter oral pain relief medications such as acetaminophen or aspirin, or anti-inflammatory medications such as ibuprofen or naproxen sodium. Topical treatments such as oral throat lozenges or sprays may be used as needed. Strep infections are not as easily transmitted as other respiratory infections, however we still recommend that you avoid close contact with loved ones, especially the very young and elderly.  Remember to wash your hands thoroughly throughout the day as this is the number one way to prevent the spread of infection and wipe down door knobs and counters with disinfectant.   Home Care: Only take medications as instructed by your medical team. Complete the entire course of an antibiotic. Do not take these medications with alcohol. A steam or ultrasonic humidifier can help congestion.  You can place a towel over your head and breathe in the steam from hot water coming from a faucet. Avoid close contacts especially the very young and the elderly. Cover your mouth when you cough or sneeze. Always remember to wash your hands.  Get Help Right Away If: You develop worsening fever or sinus pain. You develop a severe head ache or visual changes. Your symptoms persist after you have completed your treatment plan.  Make sure you Understand these instructions. Will watch your condition. Will get help right away if you are not doing well or get worse.   Thank you for choosing an e-visit.  Your e-visit answers were reviewed by a board  certified advanced clinical practitioner to complete your personal care plan. Depending upon the condition, your plan could have included both over the counter or prescription medications.  Please review your pharmacy choice. Make sure the pharmacy is open so you can pick up prescription now. If there is a problem, you may contact your provider through MyChart messaging and have the prescription routed to another pharmacy.  Your safety is important to us. If you have drug allergies check your prescription carefully.   For the next 24 hours you can use MyChart to ask questions about today's visit, request a non-urgent call back, or ask for a work or school excuse. You will get an email in the next two days asking about your experience. I hope that your e-visit has been valuable and will speed your recovery.  I provided 5 minutes of non face-to-face time during this encounter for chart review, medication and order placement, as well as and documentation.   

## 2022-04-20 ENCOUNTER — Ambulatory Visit: Payer: BC Managed Care – PPO | Admitting: Internal Medicine

## 2022-04-20 ENCOUNTER — Encounter: Payer: Self-pay | Admitting: Internal Medicine

## 2022-04-20 VITALS — BP 104/68 | HR 75 | Temp 98.0°F | Wt 137.0 lb

## 2022-04-20 DIAGNOSIS — R52 Pain, unspecified: Secondary | ICD-10-CM

## 2022-04-20 DIAGNOSIS — J069 Acute upper respiratory infection, unspecified: Secondary | ICD-10-CM | POA: Diagnosis not present

## 2022-04-20 DIAGNOSIS — R509 Fever, unspecified: Secondary | ICD-10-CM

## 2022-04-20 LAB — POC COVID19 BINAXNOW: SARS Coronavirus 2 Ag: NEGATIVE

## 2022-04-20 LAB — POCT RAPID STREP A (OFFICE): Rapid Strep A Screen: NEGATIVE

## 2022-04-20 LAB — POCT INFLUENZA A/B
Influenza A, POC: NEGATIVE
Influenza B, POC: NEGATIVE

## 2022-04-20 NOTE — Patient Instructions (Signed)

## 2022-04-20 NOTE — Progress Notes (Addendum)
Subjective:    Patient ID: Chloe Kelley, female    DOB: [birth year], 36 y.o.   MRN: [id number]  HPI  Patient presents to clinic today with complaint of fever, fatigue, body aches, nasal congestion and sore throat. This started 5 days but worse in the last 24 hours.  She is not blowing any mucus out of her nose.  She has had some difficulty swallowing.  She denies headache, runny nose, ear pain, cough, shortness of breath, nausea, vomiting or diarrhea.  She has tried Motrin OTC with minimal relief of symptoms. She did an E-visit 11/23 for and was prescribed Pen-V but reports she is actually feeling worse.  She has had positive exposure to strep and flu in her home.  Review of Systems     Past Medical History:  Diagnosis Date   Medical history non-contributory     Current Outpatient Medications  Medication Sig Dispense Refill   azelastine (ASTELIN) 0.1 % nasal spray Place 2 sprays into both nostrils 2 (two) times daily. Use in each nostril as directed 30 mL 0   benzonatate (TESSALON) 100 MG capsule Take 1 capsule (100 mg total) by mouth 2 (two) times daily as needed for cough. 20 capsule 0   fluconazole (DIFLUCAN) 150 MG tablet Take one tablet by mouth on Day 1. Repeat dose 2nd tablet on Day 3. 2 tablet 0   fluticasone (FLONASE) 50 MCG/ACT nasal spray Place 2 sprays into both nostrils daily. Use for 4-6 weeks then stop and use seasonally or as needed. 16 g 3   levonorgestrel (MIRENA) 20 MCG/24HR IUD 1 each by Intrauterine route once.     penicillin v potassium (VEETID) 500 MG tablet Take 1 tablet (500 mg total) by mouth 2 (two) times daily for 10 days. 20 tablet 0   predniSONE (STERAPRED UNI-PAK 21 TAB) 5 MG (21) TBPK tablet Take tapered steroid pack as directed 21 tablet 0   No current facility-administered medications for this visit.    No Known Allergies  Family History  Problem Relation Age of Onset   Healthy Mother    Healthy Father    Healthy Maternal Grandmother     Dementia Paternal Grandfather    Breast cancer Neg Hx    Colon cancer Neg Hx    Ovarian cancer Neg Hx    Heart disease Neg Hx    Stroke Neg Hx    Diabetes Neg Hx    Hypertension Neg Hx     Social History   Socioeconomic History   Marital status: Married    Spouse name: Not on file   Number of children: Not on file   Years of education: Not on file   Highest education level: Not on file  Occupational History   Not on file  Tobacco Use   Smoking status: Never   Smokeless tobacco: Never  Vaping Use   Vaping Use: Never used  Substance and Sexual Activity   Alcohol use: Yes    Comment: occasionally   Drug use: No   Sexual activity: Yes    Birth control/protection: I.U.D.    Comment: Mirena  Other Topics Concern   Not on file  Social History Narrative   Not on file   Social Determinants of Health   Financial Resource Strain: Not on file  Food Insecurity: Not on file  Transportation Needs: Not on file  Physical Activity: Not on file  Stress: Not on file  Social Connections: Not on file  Intimate Partner Violence:  Not on file     Constitutional: Patient reports fatigue, fever and body aches.  Denies headache or abrupt weight changes.  HEENT: Patient reports nasal congestion and sore throat.  Denies eye pain, eye redness, ear pain, ringing in the ears, wax buildup, runny nose, bloody nose. Respiratory: Denies difficulty breathing, shortness of breath, cough or sputum production.   Cardiovascular: Denies chest pain, chest tightness, palpitations or swelling in the hands or feet.  Gastrointestinal: Denies abdominal pain, bloating, constipation, diarrhea or blood in the stool.   No other specific complaints in a complete review of systems (except as listed in HPI above).  Objective:   Physical Exam  BP 104/68 (BP Location: Right Arm, Patient Position: Sitting, Cuff Size: Normal)   Pulse 75   Temp 98 F (36.7 C) (Oral)   Wt 137 lb (62.1 kg)   SpO2 99%   BMI 20.83  kg/m   Wt Readings from Last 3 Encounters:  11/29/20 131 lb 9.6 oz (59.7 kg)  03/07/20 130 lb 3.2 oz (59.1 kg)  02/15/19 133 lb (60.3 kg)    General: Appears her stated age, well developed, well nourished in NAD. Skin: Warm, dry and intact. No rashes noted. HEENT: Head: normal shape and size; Eyes: sclera white, no icterus, conjunctiva pink, PERRLA and EOMs intact; Throat/Mouth: Teeth present, mucosa erythematous and moist, no exudate, lesions or ulcerations noted.  Neck: No adenopathy noted. Cardiovascular: Normal rate and rhythm. S1,S2 noted.  No murmur, rubs or gallops noted.  Pulmonary/Chest: Normal effort and positive vesicular breath sounds. No respiratory distress. No wheezes, rales or ronchi noted.  Neurological: Alert and oriented.   BMET    Component Value Date/Time   NA 138 03/07/2020 1549   K 4.1 03/07/2020 1549   CL 104 03/07/2020 1549   CO2 26 03/07/2020 1549   GLUCOSE 82 03/07/2020 1549   BUN 13 03/07/2020 1549   CREATININE 0.69 03/07/2020 1549   CALCIUM 9.2 03/07/2020 1549   GFRNONAA 114 03/07/2020 1549   GFRAA 132 03/07/2020 1549    Lipid Panel  No results found for: "CHOL", "TRIG", "HDL", "CHOLHDL", "VLDL", "LDLCALC"  CBC    Component Value Date/Time   WBC 21.4 (H) 08/14/2016 0527   RBC 3.31 (L) 08/14/2016 0527   HGB 10.2 (L) 08/14/2016 0527   HGB 9.6 (L) 07/29/2016 0959   HCT 29.9 (L) 08/14/2016 0527   HCT 28.2 (L) 07/29/2016 0959   PLT 290 08/14/2016 0527   PLT 192 07/29/2016 0959   MCV 90.3 08/14/2016 0527   MCV 93 07/29/2016 0959   MCH 30.7 08/14/2016 0527   MCHC 34.0 08/14/2016 0527   RDW 12.8 08/14/2016 0527   RDW 12.9 07/29/2016 0959   LYMPHSABS 1.8 08/13/2016 1829   LYMPHSABS 1.2 07/29/2016 0959   MONOABS 0.9 08/13/2016 1829   EOSABS 0.1 08/13/2016 1829   EOSABS 0.1 07/29/2016 0959   BASOSABS 0.0 08/13/2016 1829   BASOSABS 0.0 07/29/2016 0959    Hgb A1C No results found for: "HGBA1C"         Assessment & Plan:   Viral  URI:  Rapid strep negative Rapid flu negative Rapid COVID-negative Advised her that this could be some other viral respiratory infection Discussed that she may want to stop the antibiotics, especially if she feels like they are not helpful Depo Medrol 80 mg IM x 1 Encourage rest and fluids Okay to continue Ibuprofen, salt water gargles for throat inflammation  Schedule an appointment for your annual exam Nicki Reaper, NP

## 2022-06-08 ENCOUNTER — Encounter: Payer: Self-pay | Admitting: Internal Medicine

## 2022-06-08 ENCOUNTER — Ambulatory Visit: Payer: BC Managed Care – PPO | Admitting: Internal Medicine

## 2022-06-08 VITALS — BP 106/70 | HR 64 | Temp 95.9°F | Wt 135.0 lb

## 2022-06-08 DIAGNOSIS — R197 Diarrhea, unspecified: Secondary | ICD-10-CM

## 2022-06-08 DIAGNOSIS — R1084 Generalized abdominal pain: Secondary | ICD-10-CM | POA: Diagnosis not present

## 2022-06-08 DIAGNOSIS — R110 Nausea: Secondary | ICD-10-CM | POA: Diagnosis not present

## 2022-06-08 MED ORDER — OMEPRAZOLE 20 MG PO CPDR: 20.0000 mg | DELAYED_RELEASE_CAPSULE | Freq: Every day | ORAL | 0 refills | Status: AC

## 2022-06-08 NOTE — Progress Notes (Signed)
Subjective:    Patient ID: Chloe Kelley, female    DOB: 1986-05-16, 37 y.o.   MRN: [id number]  HPI  Patient presents to clinic today with complaint of nausea, abdominal pain and diarrhea. This started 3 weeks ago but worsened in the last week. She describes the pain as crampy. The pain worsens when she eats. She has not been able to attribute this to specific foods. The diarrhea has been intermittent. She denies reflux, vomiting, constipation or blood in her stool. She denies recent changes in diet or medications.  She denies urinary or vaginal symptoms.  She denies recent increase in stress.  No one in her home has similar symptoms.  Review of Systems     Past Medical History:  Diagnosis Date   Medical history non-contributory     Current Outpatient Medications  Medication Sig Dispense Refill   azelastine (ASTELIN) 0.1 % nasal spray Place 2 sprays into both nostrils 2 (two) times daily. Use in each nostril as directed 30 mL 0   benzonatate (TESSALON) 100 MG capsule Take 1 capsule (100 mg total) by mouth 2 (two) times daily as needed for cough. 20 capsule 0   fluconazole (DIFLUCAN) 150 MG tablet Take one tablet by mouth on Day 1. Repeat dose 2nd tablet on Day 3. 2 tablet 0   fluticasone (FLONASE) 50 MCG/ACT nasal spray Place 2 sprays into both nostrils daily. Use for 4-6 weeks then stop and use seasonally or as needed. 16 g 3   levonorgestrel (MIRENA) 20 MCG/24HR IUD 1 each by Intrauterine route once.     predniSONE (STERAPRED UNI-PAK 21 TAB) 5 MG (21) TBPK tablet Take tapered steroid pack as directed 21 tablet 0   No current facility-administered medications for this visit.    No Known Allergies  Family History  Problem Relation Age of Onset   Healthy Mother    Healthy Father    Healthy Maternal Grandmother    Dementia Paternal Grandfather    Breast cancer Neg Hx    Colon cancer Neg Hx    Ovarian cancer Neg Hx    Heart disease Neg Hx    Stroke Neg Hx    Diabetes Neg Hx     Hypertension Neg Hx     Social History   Socioeconomic History   Marital status: Married    Spouse name: Not on file   Number of children: Not on file   Years of education: Not on file   Highest education level: Not on file  Occupational History   Not on file  Tobacco Use   Smoking status: Never   Smokeless tobacco: Never  Vaping Use   Vaping Use: Never used  Substance and Sexual Activity   Alcohol use: Yes    Comment: occasionally   Drug use: No   Sexual activity: Yes    Birth control/protection: I.U.D.    Comment: Mirena  Other Topics Concern   Not on file  Social History Narrative   Not on file   Social Determinants of Health   Financial Resource Strain: Not on file  Food Insecurity: Not on file  Transportation Needs: Not on file  Physical Activity: Not on file  Stress: Not on file  Social Connections: Not on file  Intimate Partner Violence: Not on file     Constitutional: Denies fever, malaise, fatigue, headache or abrupt weight changes.  HEENT: Denies eye pain, eye redness, ear pain, ringing in the ears, wax buildup, runny nose, nasal congestion, bloody  nose, or sore throat. Respiratory: Denies difficulty breathing, shortness of breath, cough or sputum production.   Cardiovascular: Denies chest pain, chest tightness, palpitations or swelling in the hands or feet.  Gastrointestinal: Patient reports nausea, abdominal pain and intermittent diarrhea.  Denies bloating, constipation, or blood in the stool.  GU: Denies urgency, frequency, pain with urination, burning sensation, blood in urine, odor or discharge.  No other specific complaints in a complete review of systems (except as listed in HPI above).  Objective:   Physical Exam   BP 106/70 (BP Location: Left Arm, Patient Position: Sitting, Cuff Size: Normal)   Pulse 64   Temp (!) 95.9 F (35.5 C) (Temporal)   Wt 135 lb (61.2 kg)   SpO2 100%   BMI 20.53 kg/m   Wt Readings from Last 3 Encounters:   04/20/22 137 lb (62.1 kg)  11/29/20 131 lb 9.6 oz (59.7 kg)  03/07/20 130 lb 3.2 oz (59.1 kg)    General: Appears her stated age, well developed, well nourished in NAD. Cardiovascular: Normal rate and rhythm. S1,S2 noted.  No murmur, rubs or gallops noted.  Pulmonary/Chest: Normal effort and positive vesicular breath sounds. No respiratory distress. No wheezes, rales or ronchi noted.  Abdomen: Soft and generally tender.  Hyperactive bowel sounds. No distention or masses noted. Liver, spleen and kidneys non palpable. Musculoskeletal: No difficulty with gait.  Neurological: Alert and oriented.    BMET    Component Value Date/Time   NA 138 03/07/2020 1549   K 4.1 03/07/2020 1549   CL 104 03/07/2020 1549   CO2 26 03/07/2020 1549   GLUCOSE 82 03/07/2020 1549   BUN 13 03/07/2020 1549   CREATININE 0.69 03/07/2020 1549   CALCIUM 9.2 03/07/2020 1549   GFRNONAA 114 03/07/2020 1549   GFRAA 132 03/07/2020 1549    Lipid Panel  No results found for: "CHOL", "TRIG", "HDL", "CHOLHDL", "VLDL", "LDLCALC"  CBC    Component Value Date/Time   WBC 21.4 (H) 08/14/2016 0527   RBC 3.31 (L) 08/14/2016 0527   HGB 10.2 (L) 08/14/2016 0527   HGB 9.6 (L) 07/29/2016 0959   HCT 29.9 (L) 08/14/2016 0527   HCT 28.2 (L) 07/29/2016 0959   PLT 290 08/14/2016 0527   PLT 192 07/29/2016 0959   MCV 90.3 08/14/2016 0527   MCV 93 07/29/2016 0959   MCH 30.7 08/14/2016 0527   MCHC 34.0 08/14/2016 0527   RDW 12.8 08/14/2016 0527   RDW 12.9 07/29/2016 0959   LYMPHSABS 1.8 08/13/2016 1829   LYMPHSABS 1.2 07/29/2016 0959   MONOABS 0.9 08/13/2016 1829   EOSABS 0.1 08/13/2016 1829   EOSABS 0.1 07/29/2016 0959   BASOSABS 0.0 08/13/2016 1829   BASOSABS 0.0 07/29/2016 0959    Hgb A1C No results found for: "HGBA1C"         Assessment & Plan:   Generalized Abdominal Pain, Nausea, Intermittent Diarrhea:  Will check CBC, c-Met, amylase, lipase and H. Pylori Rx for Omeprazole 20 mg daily x 2  weeks  Schedule an appointment for your annual exam Webb Silversmith, NP

## 2022-06-08 NOTE — Patient Instructions (Signed)
Abdominal Pain, Adult Many things can cause belly (abdominal) pain. Most times, belly pain is not dangerous. Many cases of belly pain can be watched and treated at home. Sometimes, though, belly pain is serious. Your doctor will try to find the cause of your belly pain. Follow these instructions at home:  Medicines Take over-the-counter and prescription medicines only as told by your doctor. Do not take medicines that help you poop (laxatives) unless told by your doctor. General instructions Watch your belly pain for any changes. Drink enough fluid to keep your pee (urine) pale yellow. Keep all follow-up visits as told by your doctor. This is important. Contact a doctor if: Your belly pain changes or gets worse. You are not hungry, or you lose weight without trying. You are having trouble pooping (constipated) or have watery poop (diarrhea) for more than 2-3 days. You have pain when you pee or poop. Your belly pain wakes you up at night. Your pain gets worse with meals, after eating, or with certain foods. You are vomiting and cannot keep anything down. You have a fever. You have blood in your pee. Get help right away if: Your pain does not go away as soon as your doctor says it should. You cannot stop vomiting. Your pain is only in areas of your belly, such as the right side or the left lower part of the belly. You have bloody or black poop, or poop that looks like tar. You have very bad pain, cramping, or bloating in your belly. You have signs of not having enough fluid or water in your body (dehydration), such as: Dark pee, very little pee, or no pee. Cracked lips. Dry mouth. Sunken eyes. Sleepiness. Weakness. You have trouble breathing or chest pain. Summary Many cases of belly pain can be watched and treated at home. Watch your belly pain for any changes. Take over-the-counter and prescription medicines only as told by your doctor. Contact a doctor if your belly pain  changes or gets worse. Get help right away if you have very bad pain, cramping, or bloating in your belly. This information is not intended to replace advice given to you by your health care provider. Make sure you discuss any questions you have with your health care provider. Document Revised: 09/19/2018 Document Reviewed: 09/19/2018 Elsevier Patient Education  2023 [employer] Inc[employer]  

## 2022-06-10 LAB — COMPLETE METABOLIC PANEL WITH GFR
AG Ratio: 2.2 (calc) (ref 1.0–2.5)
ALT: 16 U/L (ref 6–29)
AST: 18 U/L (ref 10–30)
Albumin: 4.1 g/dL (ref 3.6–5.1)
Alkaline phosphatase (APISO): 43 U/L (ref 31–125)
BUN: 12 mg/dL (ref 7–25)
CO2: 31 mmol/L (ref 20–32)
Calcium: 9 mg/dL (ref 8.6–10.2)
Chloride: 107 mmol/L (ref 98–110)
Creat: 0.67 mg/dL (ref 0.50–0.97)
Globulin: 1.9 g/dL (calc) (ref 1.9–3.7)
Glucose, Bld: 67 mg/dL (ref 65–99)
Potassium: 4.3 mmol/L (ref 3.5–5.3)
Sodium: 141 mmol/L (ref 135–146)
Total Bilirubin: 0.5 mg/dL (ref 0.2–1.2)
Total Protein: 6 g/dL — ABNORMAL LOW (ref 6.1–8.1)
eGFR: 116 mL/min/{1.73_m2} (ref >=60)

## 2022-06-10 LAB — CBC
HCT: 36.4 % (ref 35.0–45.0)
Hemoglobin: 12.4 g/dL (ref 11.7–15.5)
MCH: 30.3 pg (ref 27.0–33.0)
MCHC: 34.1 g/dL (ref 32.0–36.0)
MCV: 89 fL (ref 80.0–100.0)
MPV: 12.1 fL (ref 7.5–12.5)
Platelets: 232 10*3/uL (ref 140–400)
RBC: 4.09 10*6/uL (ref 3.80–5.10)
RDW: 11.9 % (ref 11.0–15.0)
WBC: 3.7 10*3/uL — ABNORMAL LOW (ref 3.8–10.8)

## 2022-06-10 LAB — H. PYLORI BREATH TEST: H. pylori Breath Test: NOT DETECTED

## 2022-06-10 LAB — LIPASE: Lipase: 19 U/L (ref 7–60)

## 2022-06-10 LAB — AMYLASE: Amylase: 56 U/L (ref 21–101)

## 2023-07-07 ENCOUNTER — Encounter: Payer: Self-pay | Admitting: Family Medicine

## 2023-07-07 ENCOUNTER — Ambulatory Visit: Payer: 59 | Admitting: Family Medicine

## 2023-07-07 VITALS — BP 110/70 | HR 68 | Temp 98.9°F | Ht 68.0 in | Wt 139.0 lb

## 2023-07-07 DIAGNOSIS — R6889 Other general symptoms and signs: Secondary | ICD-10-CM | POA: Diagnosis not present

## 2023-07-07 DIAGNOSIS — Z20828 Contact with and (suspected) exposure to other viral communicable diseases: Secondary | ICD-10-CM | POA: Diagnosis not present

## 2023-07-07 LAB — POC COVID19/FLU A&B COMBO
Covid Antigen, POC: NEGATIVE
Influenza A Antigen, POC: NEGATIVE
Influenza B Antigen, POC: NEGATIVE

## 2023-07-07 MED ORDER — OSELTAMIVIR PHOSPHATE 75 MG PO CAPS: 75.0000 mg | ORAL_CAPSULE | Freq: Two times a day (BID) | ORAL | 0 refills | Status: AC

## 2023-07-07 NOTE — Progress Notes (Signed)
Subjective:    Patient ID: Chloe Kelley, female    DOB: 1986/01/20, 38 y.o.   MRN: [id number]  Chloe Kelley is a 38 y.o. female presenting on 07/07/2023 for Influenza  PCP Nicki Reaper, FNP  Patient presents for a same day appointment.   HPI  Discussed the use of AI scribe software for clinical note transcription with the patient, who gave verbal consent to proceed.  History of Present Illness   Chloe Kelley is a 38 year old female who presents with fever, headache, chills, and body aches.  Symptoms began yesterday morning at around 9 AM while at work, starting with a headache and progressing to chills and general malaise. She also experienced stomach discomfort but no significant gastrointestinal symptoms. By the evening, around 6:30 PM, she recorded a fever of 101F. The following morning, she continued to feel unwell with persistent body aches, chills, and the onset of congestion.  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 Despite this, her COVID-19 and flu tests were negative today.  She took Motrin and rested, but continued to feel unwell the following morning. She alternates Motrin and Tylenol every three hours for fever and body aches.  She received a flu vaccination in November at her workplace. She has a history of flu two years ago during Thanksgiving, for which Tamiflu was effective.  No cough and does not frequently get sick. She typically manages symptoms with Motrin and occasionally DayQuil or NyQuil if symptoms are severe. She reports an unsettled stomach but not full nausea. She does not use nasal sprays for congestion and has no history of asthma or respiratory illnesses.           07/07/2023    9:52 AM 06/08/2022    9:36 AM 11/29/2020    8:40 AM  Depression screen PHQ 2/9  Decreased Interest 0 0 0  Down, Depressed, Hopeless 0 0 0  PHQ - 2 Score 0 0 0  Altered sleeping    0  Tired, decreased energy   0  Change in appetite   0  Feeling bad or failure about yourself    0  Trouble concentrating   0  Moving slowly or fidgety/restless   0  Suicidal thoughts   0  PHQ-9 Score   0  Difficult doing work/chores   Not difficult at all       07/07/2023    9:52 AM 06/08/2022    9:36 AM 11/29/2020    8:40 AM  GAD 7 : Generalized Anxiety Score  Nervous, Anxious, on Edge 1 0 0  Control/stop worrying 0 0 0  Worry too much - different things 0 0 0  Trouble relaxing 0 0 0  Restless 0 0 0  Easily annoyed or irritable 0 0 0  Afraid - awful might happen 0 0 0  Total GAD 7 Score 1 0 0  Anxiety Difficulty Somewhat difficult Not difficult at all Not difficult at all    Social History   Tobacco Use   Smoking status: Never   Smokeless tobacco: Never  Vaping Use   Vaping status: Never Used  Substance Use Topics   Alcohol use: Yes    Comment: occasionally   Drug use: No    Review of Systems Per HPI unless specifically indicated above     Objective:    BP 110/70   Pulse 68   Temp  98.9 F (37.2 C)   Ht 5\' 8"  (1.727 m)   Wt 139 lb (63 kg)   BMI 21.13 kg/m   Wt Readings from Last 3 Encounters:  07/07/23 139 lb (63 kg)  06/08/22 135 lb (61.2 kg)  04/20/22 137 lb (62.1 kg)    Physical Exam Vitals and nursing note reviewed.  Constitutional:      General: She is not in acute distress.    Appearance: She is well-developed. She is not diaphoretic.     Comments: Well-appearing, comfortable, cooperative  HENT:     Head: Normocephalic and atraumatic.     Right Ear: Ear canal and external ear normal. There is no impacted cerumen.     Left Ear: Ear canal and external ear normal. There is no impacted cerumen.     Ears:     Comments: Mild TM effusion Eyes:     General:        Right eye: No discharge.        Left eye: No discharge.     Conjunctiva/sclera: Conjunctivae normal.  Neck:     Thyroid: No thyromegaly.  Cardiovascular:     Rate and Rhythm: Normal  rate and regular rhythm.     Heart sounds: Normal heart sounds. No murmur heard. Pulmonary:     Effort: Pulmonary effort is normal. No respiratory distress.     Breath sounds: Normal breath sounds. No wheezing or rales.  Musculoskeletal:        General: Normal range of motion.     Cervical back: Normal range of motion and neck supple.  Lymphadenopathy:     Cervical: No cervical adenopathy.  Skin:    General: Skin is warm and dry.     Findings: No erythema or rash.  Neurological:     Mental Status: She is alert and oriented to person, place, and time.  Psychiatric:        Behavior: Behavior normal.     Comments: Well groomed, good eye contact, normal speech and thoughts     Results for orders placed or performed in visit on 07/07/23  POC Covid19/Flu A&B Antigen   Collection Time: 07/07/23  1:14 PM  Result Value Ref Range   Influenza A Antigen, POC Negative Negative   Influenza B Antigen, POC Negative Negative   Covid Antigen, POC Negative Negative      Assessment & Plan:   Problem List Items Addressed This Visit   None Visit Diagnoses       Flu-like symptoms    -  Primary   Relevant Medications   oseltamivir (TAMIFLU) 75 MG capsule   Other Relevant Orders   POC Covid19/Flu A&B Antigen (Completed)     Exposure to the flu       Relevant Medications   oseltamivir (TAMIFLU) 75 MG capsule   Other Relevant Orders   POC Covid19/Flu A&B Antigen (Completed)         Influenza-like Illness Symptoms started yesterday with headache, chills, malaise, and abdominal discomfort. Fever of 101 degrees Fahrenheit noted. No cough or respiratory distress. Flu and COVID tests negative today, but within 24 hours of symptom onset. Vaccinated for flu in November. 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  -Start Tamiflu empiric therapy -Advise to alternate Motrin and Tylenol every three hours for fever and body aches. -Recommend Sudafed for congestion relief. -Provide two work  notes:one for return on Friday and another for return on Monday, to be used as needed based on  symptom resolution.  Follow-up Monitor symptoms and seek medical attention if condition worsens.         Orders Placed This Encounter  Procedures   POC Covid19/Flu A&B Antigen    Meds ordered this encounter  Medications   oseltamivir (TAMIFLU) 75 MG capsule    Sig: Take 1 capsule (75 mg total) by mouth 2 (two) times daily. For 5 days    Dispense:  10 capsule    Refill:  0    Follow up plan: Return if symptoms worsen or fail to improve.   Saralyn Pilar, DO [facility] 07/07/2023, 10:17 AM

## 2023-07-07 NOTE — Patient Instructions (Addendum)
Thank you for coming to the office today.  Your flu test was NEGATIVE, this is not 100% though, and you can still have the flu with a negative test, otherwise it could be a different viral syndrome.  Also COVID negative.  - Start Flu medicine today Tamiflu (anti-flu medicine) take one capsule 75mg  twice a day for 5 days   - For symptom control (these are optional OTC medicines)      - Take Ibuprofen / Advil 400-600mg  every 6-8 hours as needed for fever / muscle aches, and may also take Tylenol 500-1000mg  per dose every 6-8 hours or 3 times a day, can alternate dosing     - May try OTC Mucinex up to 7-10 days then stop   - Wash hands and cover cough very well to avoid spread of infection - Improve hydration with plenty of clear fluids    If significant worsening with poor fluid intake, worsening fever, difficulty breathing due to coughing, worsening body aches, weakness, or other more concerning symptoms difficulty breathing you can seek treatment at Emergency Department. Also if improved flu symptoms and then worsening days to week later with concerns for bronchitis, productive cough fever chills again we may need to check for possible pneumonia that can occur after the flu    Please schedule a Follow-up Appointment to: Return if symptoms worsen or fail to improve.  If you have any other questions or concerns, please feel free to call the office or send a message through MyChart. You may also schedule an earlier appointment if necessary.  Additionally, you may be receiving a survey about your experience at our office within a few days to 1 week by e-mail or mail. We value your feedback.  Saralyn Pilar, DO [facility], [place]

## 2023-10-03 ENCOUNTER — Emergency Department: Admission: EM | Admit: 2023-10-03 | Discharge: 2023-10-03 | Disposition: A | Discharge status: Home / Self Care

## 2023-10-03 ENCOUNTER — Other Ambulatory Visit: Payer: Self-pay

## 2023-10-03 ENCOUNTER — Emergency Department

## 2023-10-03 DIAGNOSIS — R110 Nausea: Secondary | ICD-10-CM | POA: Diagnosis not present

## 2023-10-03 DIAGNOSIS — R109 Unspecified abdominal pain: Secondary | ICD-10-CM

## 2023-10-03 DIAGNOSIS — R1031 Right lower quadrant pain: Secondary | ICD-10-CM | POA: Insufficient documentation

## 2023-10-03 LAB — CBC WITH DIFFERENTIAL/PLATELET
Abs Immature Granulocytes: 0.02 10*3/uL (ref 0.00–0.07)
Basophils Absolute: 0 10*3/uL (ref 0.0–0.1)
Basophils Relative: 1 %
Eosinophils Absolute: 0.1 10*3/uL (ref 0.0–0.5)
Eosinophils Relative: 3 %
HCT: 38.8 % (ref 36.0–46.0)
Hemoglobin: 13.3 g/dL (ref 12.0–15.0)
Immature Granulocytes: 0 %
Lymphocytes Relative: 39 %
Lymphs Abs: 2 10*3/uL (ref 0.7–4.0)
MCH: 30.9 pg (ref 26.0–34.0)
MCHC: 34.3 g/dL (ref 30.0–36.0)
MCV: 90 fL (ref 80.0–100.0)
Monocytes Absolute: 0.4 10*3/uL (ref 0.1–1.0)
Monocytes Relative: 7 %
Neutro Abs: 2.6 10*3/uL (ref 1.7–7.7)
Neutrophils Relative %: 50 %
Platelets: 206 10*3/uL (ref 150–400)
RBC: 4.31 MIL/uL (ref 3.87–5.11)
RDW: 11.2 % — ABNORMAL LOW (ref 11.5–15.5)
WBC: 5.2 10*3/uL (ref 4.0–10.5)
nRBC: 0 % (ref 0.0–0.2)

## 2023-10-03 LAB — COMPREHENSIVE METABOLIC PANEL WITH GFR
ALT: 17 U/L (ref 0–44)
AST: 19 U/L (ref 15–41)
Albumin: 4.4 g/dL (ref 3.5–5.0)
Alkaline Phosphatase: 39 U/L (ref 38–126)
Anion gap: 7 (ref 5–15)
BUN: 14 mg/dL (ref 6–20)
CO2: 26 mmol/L (ref 22–32)
Calcium: 8.9 mg/dL (ref 8.9–10.3)
Chloride: 104 mmol/L (ref 98–111)
Creatinine, Ser: 0.71 mg/dL (ref 0.44–1.00)
GFR, Estimated: >60 mL/min (ref >60)
Glucose, Bld: 88 mg/dL (ref 70–99)
Potassium: 3.6 mmol/L (ref 3.5–5.1)
Sodium: 137 mmol/L (ref 135–145)
Total Bilirubin: 0.8 mg/dL (ref 0.0–1.2)
Total Protein: 7.1 g/dL (ref 6.5–8.1)

## 2023-10-03 LAB — POC URINE PREG, ED: Preg Test, Ur: NEGATIVE

## 2023-10-03 LAB — URINALYSIS, ROUTINE W REFLEX MICROSCOPIC
Bacteria, UA: NONE SEEN
Bilirubin Urine: NEGATIVE
Glucose, UA: NEGATIVE mg/dL
Ketones, ur: NEGATIVE mg/dL
Leukocytes,Ua: NEGATIVE
Nitrite: NEGATIVE
Protein, ur: NEGATIVE mg/dL
Specific Gravity, Urine: 1.009 (ref 1.005–1.030)
pH: 6 (ref 5.0–8.0)

## 2023-10-03 LAB — LIPASE, BLOOD: Lipase: 29 U/L (ref 11–51)

## 2023-10-03 MED ORDER — ONDANSETRON HCL 4 MG/2ML IJ SOLN
4.0000 mg | Freq: Once | INTRAMUSCULAR | Status: AC
  Administered 2023-10-03: 4 mg via INTRAVENOUS
  Filled 2023-10-03: qty 2

## 2023-10-03 MED ORDER — KETOROLAC TROMETHAMINE 30 MG/ML IJ SOLN
30.0000 mg | Freq: Once | INTRAMUSCULAR | Status: AC
  Administered 2023-10-03: 30 mg via INTRAVENOUS
  Filled 2023-10-03: qty 1

## 2023-10-03 MED ORDER — NAPROXEN 500 MG PO TABS: 500.0000 mg | ORAL_TABLET | Freq: Two times a day (BID) | ORAL | 2 refills | Status: AC

## 2023-10-03 MED ORDER — IOHEXOL 300 MG/ML  SOLN
80.0000 mL | Freq: Once | INTRAMUSCULAR | Status: AC | PRN
  Administered 2023-10-03: 80 mL via INTRAVENOUS

## 2023-10-03 NOTE — Discharge Instructions (Addendum)
 Your evaluated in the ED for abdominal pain and right hip pain.  Your lab work is reassuring.  Your urinalysis is normal.  Your CT abdomen and pelvis performed today reveals no acute illness or injury to the abdomen or pelvis.  There was a incidental finding of left dilation of your ovarian vein which can be seen with pelvic congestion syndrome.  For further evaluation of this finding we would like for you to follow-up with your OB/GYN.   In the interim we have sent naproxen to your pharmacy to manage her pain.  Try this medication for 1 week for improvement of symptoms.  Follow-up with your PCP  If symptoms worsen please return to ED for further evaluation.

## 2023-10-03 NOTE — ED Triage Notes (Signed)
 Pt c/o intermittent RLQ pain since Friday. Pt reports pain started radiating into her upper leg and around to her back. Pt reports low grade fever at home this evening. Pt denies any dysuria. Abdominal surgical hx include CS x2. Pt reports light touch is even painful on her skin in the area of her abdominal pain. Pt denies n/v/d.

## 2023-10-03 NOTE — ED Provider Notes (Signed)
 [facility] Emergency Department Provider Note     Event Date/Time   First MD Initiated Contact with Patient 10/03/23 [phone]     (approximate)   History   Abdominal Pain   HPI  Chloe Kelley is Kelley 38 y.o. female with no significant past medical history presents to the ED for evaluation of intermittent RLQ pain x 2 days ago.  Associated nausea without.  Patient reports the pain has radiated to her right hip and around to her back.  Denies any urinary symptoms.  No injury or trauma.  Denies fever, recent illnesses, recent travel, diarrhea and constipation.  No history of kidney stones.    Physical Exam   Triage Vital Signs: ED Triage Vitals [10/03/23 1750]  Encounter Vitals Group     BP 115/85     Systolic BP Percentile      Diastolic BP Percentile      Pulse Rate (!) 57     Resp 15     Temp 98.3 F (36.8 C)     Temp Source Oral     SpO2 100 %     Weight 135 lb (61.2 kg)     Height 5\' 8"  (1.727 m)     Head Circumference      Peak Flow      Pain Score 7     Pain Loc      Pain Education      Exclude from Growth Chart     Most recent vital signs: Vitals:   10/03/23 1750  BP: 115/85  Pulse: (!) 57  Resp: 15  Temp: 98.3 F (36.8 C)  SpO2: 100%    General: Alert and oriented. INAD.  Skin:  Warm, dry and intact. No rashes or lesions noted.     Head:  NCAT.  CV:  Good peripheral perfusion. RRR.  RESP:  Normal effort. LCTAB.  ABD:  No distention. Soft, moderate tenderness to RLQ and RUQ. no masses or organomegaly. No CVA tenderness bilaterally.  BACK:  Spinous process is midline without deformity or tenderness. MSK:   Full ROM in all joints. No swelling, deformity - mild tenderness of right hip.  NEURO: Cranial nerves intact. No focal deficits. Sensation and motor function intact. Gait is steady.    ED Results / Procedures / Treatments   Labs (all labs ordered are listed, but only abnormal results are displayed) Labs Reviewed  CBC  WITH DIFFERENTIAL/PLATELET - Abnormal; Notable for the following components:      Result Value   RDW 11.2 (*)    All other components within normal limits  URINALYSIS, ROUTINE W REFLEX MICROSCOPIC - Abnormal; Notable for the following components:   Color, Urine STRAW (*)    APPearance CLEAR (*)    Hgb urine dipstick SMALL (*)    All other components within normal limits  COMPREHENSIVE METABOLIC PANEL WITH GFR  LIPASE, BLOOD  POC URINE PREG, ED   RADIOLOGY  I personally viewed and evaluated these images as part of my medical decision making, as well as reviewing the written report by the radiologist.  ED Provider Interpretation: No acute abdomen or pelvis abnormalities.  CT ABDOMEN PELVIS W CONTRAST Result Date: 10/03/2023 CLINICAL DATA:  Right lower quadrant pain. EXAM: CT ABDOMEN AND PELVIS WITH CONTRAST TECHNIQUE: Multidetector CT imaging of the abdomen and pelvis was performed using the standard protocol following bolus administration of intravenous contrast. RADIATION DOSE REDUCTION: This exam was performed according to the departmental dose-optimization program  which includes automated exposure control, adjustment of the mA and/or kV according to patient size and/or use of iterative reconstruction technique. CONTRAST:  80mL OMNIPAQUE IOHEXOL 300 MG/ML  SOLN COMPARISON:  None Available. FINDINGS: Lower chest: No basilar airspace disease or pleural effusion. 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 Hepatobiliary: Tiny hypodensity in the inferior right lobe of the liver, too small to characterize. No suspicious liver lesion. Gallbladder physiologically distended, no calcified stone. No biliary dilatation. Pancreas: Unremarkable. No pancreatic ductal dilatation or surrounding inflammatory changes. Spleen: Normal in size without focal abnormality. Adrenals/Urinary Tract:  No adrenal nodule. No hydronephrosis, renal calculi, or renal inflammation. Mild bladder distention without wall thickening. Stomach/Bowel: Normal appendix, series 2, image 50. There is no small bowel obstruction or inflammatory change. No terminal ileal inflammation. Small to moderate volume of stool in the colon. No colonic wall thickening. Vascular/Lymphatic: Normal caliber abdominal aorta. The portal vein is patent. No suspicious abdominopelvic adenopathy. Reproductive: IUD in the uterus. There is no adnexal mass. Prominent left periuterine and adnexal vascularity with dilatation of the ovarian vein at 7 mm. Other: No ascites or free air.  No abdominal wall hernia. Musculoskeletal: Limbic vertebra at L4. There are no acute or suspicious osseous abnormalities. IMPRESSION: 1. No acute abnormality in the abdomen/pelvis. Normal appendix. 2. Prominent left periuterine and adnexal vascularity with dilatation of the ovarian vein. This can be seen with pelvic congestion syndrome in the appropriate clinical setting. 3. 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 Electronically Signed   By: Chadwick Colonel M.D.   On: 10/03/2023 21:32    PROCEDURES:  Critical Care performed: No  Procedures   MEDICATIONS ORDERED IN ED: Medications  ketorolac (TORADOL) 30 MG/ML injection 30 mg (30 mg Intravenous Given 10/03/23 2001)  ondansetron  (ZOFRAN ) injection 4 mg (4 mg Intravenous Given 10/03/23 2002)  iohexol (OMNIPAQUE) 300 MG/ML solution 80 mL (80 mLs Intravenous Contrast Given 10/03/23 2012)     IMPRESSION / MDM / ASSESSMENT AND PLAN / ED COURSE  I reviewed the triage vital signs and the nursing notes.                              Clinical Course as of 10/03/23 2217  Chloe Kelley Oct 03, 2023  2049 On reassessment patient is sitting comfortable in evaluation room.  States improvement in symptoms  and pain. [MH]    Clinical Course User Index [MH] Chloe Augusta A, PA-C    38 y.o. female presents to the emergency department for evaluation and treatment of acute RLQ and right hip pain. See HPI for further details.   Differential diagnosis includes, but is not limited to,acute appendicitis, diverticulitis, urinary tract infection/pyelonephritis, renal colic, gastroenteritis, cholelithiasis  Patient's presentation is most consistent with acute complicated illness / injury requiring diagnostic workup.   Labs are reassuring.  Negative pregnancy.  Lipase normal 29.  Urinalysis reveals small Hgb but otherwise unremarkable.  Plan will be to obtain CT abdomen pelvis.  ED pain management with Toradol and Zofran  and then reassess.  ----------------------------------------- 10:16 PM on 10/03/2023 -----------------------------------------  CT scan revealed no acute abnormalities in the abdomen or pelvis.  Incidental finding of Kelley dilation of the left ovarian vein in which patient is made aware and encouraged  to follow-up with OB/GYN.  Will send naproxen to pharmacy for pain management.  Patient is encouraged to follow-up with PCP.  She is in stable condition for discharge home at this time.  ED return precautions discussed thoroughly.  Patient verbalized understanding.  FINAL CLINICAL IMPRESSION(S) / ED DIAGNOSES   Final diagnoses:  Abdominal pain, unspecified abdominal location     Rx / DC Orders   ED Discharge Orders          Ordered    naproxen (NAPROSYN) 500 MG tablet  2 times daily with meals        10/03/23 2210            Note:  This document was prepared using Dragon voice recognition software and may include unintentional dictation errors.    Phyllis Breeze, Lyrick Worland A, PA-C 10/03/23 2217    Kandee Orion, MD 10/03/23 2249

## 2023-10-14 ENCOUNTER — Inpatient Hospital Stay: Admitting: Internal Medicine

## 2023-10-14 NOTE — Progress Notes (Deleted)
 Subjective:    Patient ID: Chloe Kelley, female    DOB: March 13, 1986, 38 y.o.   MRN: [id number]  HPI    Review of Systems   Past Medical History:  Diagnosis Date   Medical history non-contributory     Current Outpatient Medications  Medication Sig Dispense Refill   levonorgestrel  (MIRENA ) 20 MCG/24HR IUD 1 each by Intrauterine route once.     naproxen  (NAPROSYN ) 500 MG tablet Take 1 tablet (500 mg total) by mouth 2 (two) times daily with a meal. 60 tablet 2   omeprazole  (PRILOSEC) 20 MG capsule Take 1 capsule (20 mg total) by mouth daily. (Patient not taking: Reported on 07/07/2023) 14 capsule 0   oseltamivir  (TAMIFLU ) 75 MG capsule Take 1 capsule (75 mg total) by mouth 2 (two) times daily. For 5 days 10 capsule 0   No current facility-administered medications for this visit.    No Known Allergies  Family History  Problem Relation Age of Onset   Healthy Mother    Healthy Father    Healthy Maternal Grandmother    Dementia Paternal Grandfather    Breast cancer Neg Hx    Colon cancer Neg Hx    Ovarian cancer Neg Hx    Heart disease Neg Hx    Stroke Neg Hx    Diabetes Neg Hx    Hypertension Neg Hx     Social History   Socioeconomic History   Marital status: Married    Spouse name: Not on file   Number of children: Not on file   Years of education: Not on file   Highest education level: Not on file  Occupational History   Not on file  Tobacco Use   Smoking status: Never   Smokeless tobacco: Never  Vaping Use   Vaping status: Never Used  Substance and Sexual Activity   Alcohol use: Yes    Comment: occasionally   Drug use: No   Sexual activity: Yes    Birth control/protection: I.U.D.    Comment: Mirena   Other Topics Concern   Not on file  Social History Narrative   Not on file   Social Drivers of Health   Financial Resource Strain: Not on file  Food Insecurity: Not on file  Transportation Needs: Not on file  Physical Activity: Not on file   Stress: Not on file  Social Connections: Not on file  Intimate Partner Violence: Not on file     Constitutional: Denies fever, malaise, fatigue, headache or abrupt weight changes.  HEENT: Denies eye pain, eye redness, ear pain, ringing in the ears, wax buildup, runny nose, nasal congestion, bloody nose, or sore throat. Respiratory: Denies difficulty breathing, shortness of breath, cough or sputum production.   Cardiovascular: Denies chest pain, chest tightness, palpitations or swelling in the hands or feet.  Gastrointestinal: Denies abdominal pain, bloating, constipation, diarrhea or blood in the stool.  GU: Denies urgency, frequency, pain with urination, burning sensation, blood in urine, odor or discharge. Musculoskeletal: Denies decrease in range of motion, difficulty with gait, muscle pain or joint pain and swelling.  Skin: Denies redness, rashes, lesions or ulcercations.  Neurological: Denies dizziness, difficulty with memory, difficulty with speech or problems with balance and coordination.  Psych: Denies anxiety, depression, SI/HI.  No other specific complaints in a complete review of systems (except as listed in HPI above).      Objective:   Physical Exam  There were no vitals taken for this visit. Wt Readings from Last 3  Encounters:  10/03/23 135 lb (61.2 kg)  07/07/23 139 lb (63 kg)  06/08/22 135 lb (61.2 kg)    General: Appears their stated age, well developed, well nourished in NAD. Skin: Warm, dry and intact. No rashes, lesions or ulcerations noted. HEENT: Head: normal shape and size; Eyes: sclera white, no icterus, conjunctiva pink, PERRLA and EOMs intact; Ears: Tm's gray and intact, normal light reflex; Nose: mucosa pink and moist, septum midline; Throat/Mouth: Teeth present, mucosa pink and moist, no exudate, lesions or ulcerations noted.  Neck:  Neck supple, trachea midline. No masses, lumps or thyromegaly present.  Cardiovascular: Normal rate and rhythm. S1,S2  noted.  No murmur, rubs or gallops noted. No JVD or BLE edema. No carotid bruits noted. Pulmonary/Chest: Normal effort and positive vesicular breath sounds. No respiratory distress. No wheezes, rales or ronchi noted.  Abdomen: Soft and nontender. Normal bowel sounds. No distention or masses noted. Liver, spleen and kidneys non palpable. Musculoskeletal: Normal range of motion. No signs of joint swelling. No difficulty with gait.  Neurological: Alert and oriented. Cranial nerves II-XII grossly intact. Coordination normal.  Psychiatric: Mood and affect normal. Behavior is normal. Judgment and thought content normal.    BMET    Component Value Date/Time   NA 137 10/03/2023 1744   K 3.6 10/03/2023 1744   CL 104 10/03/2023 1744   CO2 26 10/03/2023 1744   GLUCOSE 88 10/03/2023 1744   BUN 14 10/03/2023 1744   CREATININE 0.71 10/03/2023 1744   CREATININE 0.67 06/08/2022 0934   CALCIUM 8.9 10/03/2023 1744   GFRNONAA >60 10/03/2023 1744   GFRNONAA 114 03/07/2020 1549   GFRAA 132 03/07/2020 1549    Lipid Panel  No results found for: "CHOL", "TRIG", "HDL", "CHOLHDL", "VLDL", "LDLCALC"  CBC    Component Value Date/Time   WBC 5.2 10/03/2023 1744   RBC 4.31 10/03/2023 1744   HGB 13.3 10/03/2023 1744   HGB 9.6 (L) 07/29/2016 0959   HCT 38.8 10/03/2023 1744   HCT 28.2 (L) 07/29/2016 0959   PLT 206 10/03/2023 1744   PLT 192 07/29/2016 0959   MCV 90.0 10/03/2023 1744   MCV 93 07/29/2016 0959   MCH 30.9 10/03/2023 1744   MCHC 34.3 10/03/2023 1744   RDW 11.2 (L) 10/03/2023 1744   RDW 12.9 07/29/2016 0959   LYMPHSABS 2.0 10/03/2023 1744   LYMPHSABS 1.2 07/29/2016 0959   MONOABS 0.4 10/03/2023 1744   EOSABS 0.1 10/03/2023 1744   EOSABS 0.1 07/29/2016 0959   BASOSABS 0.0 10/03/2023 1744   BASOSABS 0.0 07/29/2016 0959    Hgb A1C No results found for: "HGBA1C"          Assessment & Plan:    Schedule an appointment for your annual exam Helayne Lo, NP
